# Patient Record
Sex: Male | Born: 1990 | Race: White | Hispanic: No | Smoking: Never smoker
Health system: Southern US, Community
[De-identification: ages and names within clinical notes are randomized; demographics above are authoritative.]

## PROBLEM LIST (undated history)

## (undated) DIAGNOSIS — I10 Essential (primary) hypertension: Secondary | ICD-10-CM

## (undated) DIAGNOSIS — K219 Gastro-esophageal reflux disease without esophagitis: Secondary | ICD-10-CM

## (undated) DIAGNOSIS — E785 Hyperlipidemia, unspecified: Secondary | ICD-10-CM

## (undated) HISTORY — DX: Hyperlipidemia, unspecified: E78.5

## (undated) HISTORY — DX: Gastro-esophageal reflux disease without esophagitis: K21.9

## (undated) HISTORY — PX: NO PAST SURGERIES: SHX2092

## (undated) HISTORY — DX: Essential (primary) hypertension: I10

---

## 2018-07-13 DIAGNOSIS — E782 Mixed hyperlipidemia: Secondary | ICD-10-CM | POA: Insufficient documentation

## 2018-07-13 DIAGNOSIS — M549 Dorsalgia, unspecified: Secondary | ICD-10-CM | POA: Insufficient documentation

## 2018-07-13 NOTE — Progress Notes (Signed)
Subjective:    Patient ID: George Clements, male    DOB: 04/03/1991, 28 y.o.   MRN: [id number]  HPI 28 y.o. MWM presents for back pain, anxiety, as new patient.   He is recently married, no kids, goes to church, and is in several bands as a Physiological scientist.   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 Describes bilateral lower back pain and at midline. He has some tingling in the front of his legs, no numbness or pain down his legs. Worse with standing for long times. No pain at night.  Patient denies fever, hematuria, incontinence, numbness, tingling, weakness and saddle anesthesia. Had Xrays at a chiropractor, shows OA per patient.   He is also a Physiological scientist and states that after he plays sometimes his hands get swollen and it can be hard to grip. States his finger tips gets numb. No neck pain. Happening intermittent X 8 years. Becoming more frequent, not worse.   Denies any rash Denies any eye problems.  BMI is Body mass index is 27.72 kg/m., he is working on diet and exercise. Wt Readings from Last 3 Encounters:  07/16/18 204 lb 6.4 oz (92.7 kg)   His blood pressure is not checked at home, today their BP is BP: (!) 138/92  He has anxiety off and on, will get flushing in his face, SOB, palpitations and pressure in his chest. Will happen randomly but more situational in a crowded place, loud noises, will last several minutes, deep breathing helps and relax.   Medications No current outpatient medications on file prior to visit.   No current facility-administered medications on file prior to visit.     Problem list He has Back pain and Mixed hyperlipidemia on their problem list.  Allergies Not on File  SURGICAL HISTORY He  has no past surgical history on file.   FAMILY HISTORY His family history includes Asthma in his brother and mother; Bipolar disorder in his sister; Scoliosis in his sister.   SOCIAL HISTORY He  reports that he has never smoked.  He has never used smokeless tobacco. He reports that he does not drink alcohol or use drugs.   Review of Systems  Constitutional: Negative.  Negative for fever.  HENT: Negative.   Respiratory: Negative.   Cardiovascular: Negative.  Negative for chest pain.  Gastrointestinal: Negative.  Negative for abdominal pain.  Genitourinary: Negative.  Negative for dysuria.  Musculoskeletal: Positive for back pain and joint swelling. Negative for arthralgias, gait problem, myalgias, neck pain and neck stiffness.  Skin: Negative.   Neurological: Negative for weakness, numbness and headaches.  Psychiatric/Behavioral: Negative for agitation, behavioral problems, confusion, decreased concentration, dysphoric mood, hallucinations, self-injury, sleep disturbance and suicidal ideas. The patient is nervous/anxious. The patient is not hyperactive.        Objective:   Physical Exam Constitutional:      General: He is not in acute distress.    Appearance: He is well-developed.  Neck:     Musculoskeletal: Normal range of motion and neck supple.  Cardiovascular:     Rate and Rhythm: Normal rate and regular rhythm.  Pulmonary:     Effort: Pulmonary effort is normal.     Breath sounds: Normal breath sounds.  Abdominal:     General: Bowel sounds are normal.     Palpations: Abdomen is soft.     Tenderness: There is no abdominal tenderness.  Musculoskeletal:     Comments: Patient is able to  ambulate well. Gait is not  Antalgic. Straight leg raising with dorsiflexion negative bilaterally for radicular symptoms. Sensory exam in the legs are normal. Knee reflexes are normal Ankle reflexes are normal Strength is normal and symmetric in arms and legs. There is SI tenderness to palpation.  There is notparaspinal muscle spasm.  There is not midline tenderness.  FROM of spine.  Bilateral neurovascular exam is normal of hands, no thenar or hypothenar atrophy, Phalen's: negative, sensation normal, strength normal and  Tinel's sign: negative. Elbow, shoulders normal.    Lymphadenopathy:     Cervical: No cervical adenopathy.  Skin:    General: Skin is warm and dry.     Findings: No rash.  Neurological:     Mental Status: He is alert and oriented to person, place, and time.     Cranial Nerves: No cranial nerve deficit.     EKG shows IRBBB, notched P waves, short PR interval, normal QRS but with slight slurring primarily in V1-V2 possible due to body habitus versus preexcitation syndrome- discussed BB- declines at this time, valsalva taught- check labs.      Assessment & Plan:  Arrington was seen today for establish care.  Diagnoses and all orders for this visit:  Vitamin D deficiency -     VITAMIN D 25 Hydroxy (Vit-D Deficiency, Fractures)  Chronic midline low back pain without sciatica -     HLA-B27 antigen - suggest PT/ortho, he is following with a chiropractor - rule out autoimmune with age and xray  Mixed hyperlipidemia -     Lipid panel check lipids decrease fatty foods increase activity.   Medication management -     CBC with Differential/Platelet -     COMPLETE METABOLIC PANEL WITH GFR  Bilateral hand pain -     Sedimentation rate -     Rheumatoid factor - likely mechanical from drumming causing a carpal tunnel - normal exam, suggest wearing brace night of shows  Anxiety -     TSH -     EKG 12-Lead - ?preexcitation syndrome- discussed BB- declines at this time, valsalva taught- check labs.  - The patient was advised to call immediately if he has any concerning symptoms in the interval. The patient voices understanding of current treatment options and is in agreement with the current care plan.The patient knows to call the clinic with any problems, questions or concerns or go to the ER if any further progression of symptoms.

## 2018-07-16 ENCOUNTER — Encounter: Payer: Self-pay | Admitting: Physician Assistant

## 2018-07-16 ENCOUNTER — Ambulatory Visit: Payer: 59 | Admitting: Physician Assistant

## 2018-07-16 VITALS — BP 138/92 | HR 78 | Temp 97.4°F | Ht 72.0 in | Wt 204.4 lb

## 2018-07-16 DIAGNOSIS — G8929 Other chronic pain: Secondary | ICD-10-CM

## 2018-07-16 DIAGNOSIS — E782 Mixed hyperlipidemia: Secondary | ICD-10-CM | POA: Diagnosis not present

## 2018-07-16 DIAGNOSIS — M545 Low back pain: Secondary | ICD-10-CM

## 2018-07-16 DIAGNOSIS — E559 Vitamin D deficiency, unspecified: Secondary | ICD-10-CM

## 2018-07-16 DIAGNOSIS — M79641 Pain in right hand: Secondary | ICD-10-CM

## 2018-07-16 DIAGNOSIS — F419 Anxiety disorder, unspecified: Secondary | ICD-10-CM

## 2018-07-16 DIAGNOSIS — I10 Essential (primary) hypertension: Secondary | ICD-10-CM | POA: Diagnosis not present

## 2018-07-16 DIAGNOSIS — Z79899 Other long term (current) drug therapy: Secondary | ICD-10-CM

## 2018-07-16 DIAGNOSIS — Z136 Encounter for screening for cardiovascular disorders: Secondary | ICD-10-CM

## 2018-07-16 DIAGNOSIS — M79642 Pain in left hand: Secondary | ICD-10-CM

## 2018-07-16 NOTE — Patient Instructions (Signed)
HYPERTENSION INFORMATION  Monitor your blood pressure at home, please keep a record and bring that in with you to your next office visit.   Go to the ER if any CP, SOB, nausea, dizziness, severe HA, changes vision/speech  Due to a recent study, SPRINT, we have changed our goal for the systolic or top blood pressure number. Ideally we want your top number at 120.  [facility], 5000 people were randomized to a goal BP of 120 and 5000 people were randomized to a goal BP of less than 140. The patients with the goal BP at 120 had LESS DEMENTIA, LESS HEART ATTACKS, AND LESS STROKES, AS WELL AS OVERALL DECREASED MORTALITY OR DEATH RATE.   If you are willing, our goal BP is the top number of 120.  Your most recent BP: BP: (!) 138/92   Take your medications faithfully as instructed. Maintain a healthy weight. Get at least 150 minutes of aerobic exercise per week. Minimize salt intake. Minimize alcohol intake  DASH Eating Plan DASH stands for "Dietary Approaches to Stop Hypertension." The DASH eating plan is a healthy eating plan that has been shown to reduce high blood pressure (hypertension). Additional health benefits may include reducing the risk of type 2 diabetes mellitus, heart disease, and stroke. The DASH eating plan may also help with weight loss. WHAT DO I NEED TO KNOW ABOUT THE DASH EATING PLAN? For the DASH eating plan, you will follow these general guidelines:  Choose foods with a percent daily value for sodium of less than 5% (as listed on the food label).  Use salt-free seasonings or herbs instead of table salt or sea salt.  Check with your health care provider or pharmacist before using salt substitutes.  Eat lower-sodium products, often labeled as "lower sodium" or "no salt added."  Eat fresh foods.  Eat more vegetables, fruits, and low-fat dairy products.  Choose whole grains. Look for the word "whole" as the first word in the ingredient list.  Choose fish and  skinless chicken or [place]key more often than red meat. Limit fish, poultry, and meat to 6 oz (170 g) each day.  Limit sweets, desserts, sugars, and sugary drinks.  Choose heart-healthy fats.  Limit cheese to 1 oz (28 g) per day.  Eat more home-cooked food and less restaurant, buffet, and fast food.  Limit fried foods.  Cook foods using methods other than frying.  Limit canned vegetables. If you do use them, rinse them well to decrease the sodium.  When eating at a restaurant, ask that your food be prepared with less salt, or no salt if possible. WHAT FOODS CAN I EAT? Seek help from a dietitian for individual calorie needs. Grains Whole grain or whole wheat bread. Brown rice. Whole grain or whole wheat pasta. Quinoa, bulgur, and whole grain cereals. Low-sodium cereals. Corn or whole wheat flour tortillas. Whole grain cornbread. Whole grain crackers. Low-sodium crackers. Vegetables Fresh or frozen vegetables (raw, steamed, roasted, or grilled). Low-sodium or reduced-sodium tomato and vegetable juices. Low-sodium or reduced-sodium tomato sauce and paste. Low-sodium or reduced-sodium canned vegetables.  Fruits All fresh, canned (in natural juice), or frozen fruits. Meat and Other Protein Products Ground beef (85% or leaner), grass-fed beef, or beef trimmed of fat. Skinless chicken or [place]key. Ground chicken or [place]key. Pork trimmed of fat. All fish and seafood. Eggs. Dried beans, peas, or lentils. Unsalted nuts and seeds. Unsalted canned beans. Dairy Low-fat dairy products, such as skim or 1% milk, 2% or reduced-fat  cheeses, low-fat ricotta or cottage cheese, or plain low-fat yogurt. Low-sodium or reduced-sodium cheeses. Fats and Oils Tub margarines without trans fats. Light or reduced-fat mayonnaise and salad dressings (reduced sodium). Avocado. Safflower, olive, or canola oils. Natural peanut or almond butter. Other Unsalted popcorn and pretzels. The items listed above may not be a  complete list of recommended foods or beverages. Contact your dietitian for more options. WHAT FOODS ARE NOT RECOMMENDED? Grains White bread. White pasta. White rice. Refined cornbread. Bagels and croissants. Crackers that contain trans fat. Vegetables Creamed or fried vegetables. Vegetables in a cheese sauce. Regular canned vegetables. Regular canned tomato sauce and paste. Regular tomato and vegetable juices. Fruits Dried fruits. Canned fruit in light or heavy syrup. Fruit juice. Meat and Other Protein Products Fatty cuts of meat. Ribs, chicken wings, bacon, sausage, bologna, salami, chitterlings, fatback, hot dogs, bratwurst, and packaged luncheon meats. Salted nuts and seeds. Canned beans with salt. Dairy Whole or 2% milk, cream, half-and-half, and cream cheese. Whole-fat or sweetened yogurt. Full-fat cheeses or blue cheese. Nondairy creamers and whipped toppings. Processed cheese, cheese spreads, or cheese curds. Condiments Onion and garlic salt, seasoned salt, table salt, and sea salt. Canned and packaged gravies. Worcestershire sauce. Tartar sauce. Barbecue sauce. Teriyaki sauce. Soy sauce, including reduced sodium. Steak sauce. Fish sauce. Oyster sauce. Cocktail sauce. Horseradish. Ketchup and mustard. Meat flavorings and tenderizers. Bouillon cubes. Hot sauce. Tabasco sauce. Marinades. Taco seasonings. Relishes. Fats and Oils Butter, stick margarine, lard, shortening, ghee, and bacon fat. Coconut, palm kernel, or palm oils. Regular salad dressings. Other Pickles and olives. Salted popcorn and pretzels. The items listed above may not be a complete list of foods and beverages to avoid. Contact your dietitian for more information. WHERE CAN I FIND MORE INFORMATION? National Heart, Lung, and Blood Institute: [employer] Document Released: 05/30/2011 Document Revised: 10/25/2013 Document Reviewed: 04/14/2013 [facility] Patient Information 2015  [place], [place]. This information is not intended to replace advice given to you by your health care provider. Make sure you discuss any questions you have with your health care provider.    Do the carpal tunnel brace from a pharmacy at night for 4-6 weeks, if this does not help we can refer to ortho.  Carpal Tunnel Syndrome  Carpal tunnel syndrome is a condition that causes pain in your hand and arm. The carpal tunnel is a narrow area located on the palm side of your wrist. Repeated wrist motion or certain diseases may cause swelling within the tunnel. This swelling pinches the main nerve in the wrist (median nerve). What are the causes? This condition may be caused by:  Repeated wrist motions.  Wrist injuries.  Arthritis.  A cyst or tumor in the carpal tunnel.  Fluid buildup during pregnancy.  Sometimes the cause of this condition is not known. What increases the risk? This condition is more likely to develop in:  People who have jobs that cause them to repeatedly move their wrists in the same motion, such as Health visitor.  Women.  People with certain conditions, such as: ? Diabetes. ? Obesity. ? An underactive thyroid (hypothyroidism). ? Kidney failure.  What are the signs or symptoms? Symptoms of this condition include:  A tingling feeling in your fingers, especially in your thumb, index, and middle fingers.  Tingling or numbness in your hand.  An aching feeling in your entire arm, especially when your wrist and elbow are bent for long periods of time.  Wrist pain that goes up your arm to  your shoulder.  Pain that goes down into your palm or fingers.  A weak feeling in your hands. You may have trouble grabbing and holding items.  Your symptoms may feel worse during the night. How is this diagnosed? This condition is diagnosed with a medical history and physical exam. You may also have tests, including:  An electromyogram (EMG). This test measures  electrical signals sent by your nerves into the muscles.  X-rays.  How is this treated? Treatment for this condition includes:  Lifestyle changes. It is important to stop doing or modify the activity that caused your condition.  Physical or occupational therapy.  Medicines for pain and inflammation. This may include medicine that is injected into your wrist.  A wrist splint.  Surgery.  Follow these instructions at home: If you have a splint:  Wear it as told by your health care provider. Remove it only as told by your health care provider.  Loosen the splint if your fingers become numb and tingle, or if they turn cold and blue.  Keep the splint clean and dry. General instructions  Take over-the-counter and prescription medicines only as told by your health care provider.  Rest your wrist from any activity that may be causing your pain. If your condition is work related, talk to your employer about changes that can be made, such as getting a wrist pad to use while typing.  If directed, apply ice to the painful area: ? Put ice in a plastic bag. ? Place a towel between your skin and the bag. ? Leave the ice on for 20 minutes, 2-3 times per day.  Keep all follow-up visits as told by your health care provider. This is important.  Do any exercises as told by your health care provider, physical therapist, or occupational therapist. Contact a health care provider if:  You have new symptoms.  Your pain is not controlled with medicines.  Your symptoms get worse. This information is not intended to replace advice given to you by your health care provider. Make sure you discuss any questions you have with your health care provider. Document Released: 06/07/2000 Document Revised: 10/19/2015 Document Reviewed: 10/26/2014 Elsevier Interactive Patient Education  [employer]2018 [employer] Inc.

## 2018-07-17 LAB — RHEUMATOID FACTOR: Rheumatoid fact SerPl-aCnc: <14 IU/mL (ref <14)

## 2018-07-17 LAB — LIPID PANEL
CHOL/HDL RATIO: 4.4 (calc) (ref <5.0)
Cholesterol: 218 mg/dL — ABNORMAL HIGH (ref <200)
HDL: 49 mg/dL (ref >40)
LDL CHOLESTEROL (CALC): 154 mg/dL — AB
Non-HDL Cholesterol (Calc): 169 mg/dL (calc) — ABNORMAL HIGH (ref <130)
Triglycerides: 58 mg/dL (ref <150)

## 2018-07-17 LAB — COMPLETE METABOLIC PANEL WITH GFR
AG Ratio: 1.7 (calc) (ref 1.0–2.5)
ALBUMIN MSPROF: 4.7 g/dL (ref 3.6–5.1)
ALT: 19 U/L (ref 9–46)
AST: 16 U/L (ref 10–40)
Alkaline phosphatase (APISO): 69 U/L (ref 40–115)
BUN: 14 mg/dL (ref 7–25)
CO2: 26 mmol/L (ref 20–32)
Calcium: 9.5 mg/dL (ref 8.6–10.3)
Chloride: 106 mmol/L (ref 98–110)
Creat: 0.9 mg/dL (ref 0.60–1.35)
GFR, Est African American: 136 mL/min/{1.73_m2} (ref >=60)
GFR, Est Non African American: 117 mL/min/{1.73_m2} (ref >=60)
Globulin: 2.7 g/dL (calc) (ref 1.9–3.7)
Glucose, Bld: 89 mg/dL (ref 65–99)
Potassium: 4.6 mmol/L (ref 3.5–5.3)
Sodium: 138 mmol/L (ref 135–146)
Total Bilirubin: 0.5 mg/dL (ref 0.2–1.2)
Total Protein: 7.4 g/dL (ref 6.1–8.1)

## 2018-07-17 LAB — SEDIMENTATION RATE: Sed Rate: 2 mm/h (ref 0–15)

## 2018-07-17 LAB — TSH: TSH: 1.32 mIU/L (ref 0.40–4.50)

## 2018-07-17 LAB — HLA-B27 ANTIGEN: HLA-B27 Antigen: NEGATIVE

## 2018-07-17 LAB — CBC WITH DIFFERENTIAL/PLATELET
Absolute Monocytes: 461 cells/uL (ref 200–950)
BASOS ABS: 19 {cells}/uL (ref 0–200)
Basophils Relative: 0.4 %
EOS ABS: 28 {cells}/uL (ref 15–500)
Eosinophils Relative: 0.6 %
HCT: 43.8 % (ref 38.5–50.0)
HEMOGLOBIN: 15.3 g/dL (ref 13.2–17.1)
Lymphs Abs: 1227 cells/uL (ref 850–3900)
MCH: 30.7 pg (ref 27.0–33.0)
MCHC: 34.9 g/dL (ref 32.0–36.0)
MCV: 87.8 fL (ref 80.0–100.0)
MPV: 10.6 fL (ref 7.5–12.5)
Monocytes Relative: 9.8 %
Neutro Abs: 2966 cells/uL (ref 1500–7800)
Neutrophils Relative %: 63.1 %
Platelets: 249 10*3/uL (ref 140–400)
RBC: 4.99 10*6/uL (ref 4.20–5.80)
RDW: 12.7 % (ref 11.0–15.0)
Total Lymphocyte: 26.1 %
WBC: 4.7 10*3/uL (ref 3.8–10.8)

## 2018-07-17 LAB — VITAMIN D 25 HYDROXY (VIT D DEFICIENCY, FRACTURES): Vit D, 25-Hydroxy: 29 ng/mL — ABNORMAL LOW (ref 30–100)

## 2018-10-31 ENCOUNTER — Other Ambulatory Visit: Payer: Self-pay | Admitting: Physician Assistant

## 2018-10-31 MED ORDER — SULFAMETHOXAZOLE-TRIMETHOPRIM 800-160 MG PO TABS: 1.0000 | ORAL_TABLET | Freq: Two times a day (BID) | ORAL | 0 refills | Status: DC

## 2018-11-02 ENCOUNTER — Other Ambulatory Visit: Payer: Self-pay

## 2018-11-02 ENCOUNTER — Ambulatory Visit (INDEPENDENT_AMBULATORY_CARE_PROVIDER_SITE_OTHER): Payer: 59 | Admitting: Physician Assistant

## 2018-11-02 VITALS — BP 142/90 | HR 101 | Temp 98.7°F | Ht 72.0 in | Wt 204.6 lb

## 2018-11-02 DIAGNOSIS — L0291 Cutaneous abscess, unspecified: Secondary | ICD-10-CM | POA: Diagnosis not present

## 2018-11-02 NOTE — Progress Notes (Signed)
   Subjective:    Patient ID: George Clements, male    DOB: October 22, 1990, 28 y.o.   MRN: [id number]  HPI 28 y.o. WM presents with abscess x Thursday morning.  He states he woke up in the morning, noticed a bump with little white dot in the middle Thursday morning, then that night he has severe swelling.  Started draining Friday, has been draining off and on since then, mostly pus with some blood. Bactrim was sent in but he has not started it yet.  No fever, chills.   Blood pressure (!) 142/90, pulse (!) 101, temperature 98.7 F (37.1 C), height 6' (1.829 m), weight 204 lb 9.6 oz (92.8 kg), SpO2 99 %.  Medications Current Outpatient Medications on File Prior to Visit  Medication Sig  . sulfamethoxazole-trimethoprim (BACTRIM DS) 800-160 MG tablet Take 1 tablet by mouth 2 (two) times daily. (Patient not taking: Reported on 11/02/2018)   No current facility-administered medications on file prior to visit.     Problem list He has Back pain and Mixed hyperlipidemia on their problem list.   Review of Systems  Constitutional: Negative.  Negative for chills and fever.  Skin: Positive for wound (right forearm).       Objective:   Physical Exam Constitutional:      General: He is not in acute distress.    Appearance: Normal appearance. He is normal weight.  HENT:     Head: Normocephalic and atraumatic.  Skin:    General: Skin is warm and dry.     Comments: Right proximal ulnar side of forearm with 5x3 indurated abscess with 2 cm area of fluctuace, with erythema, warmth, tenderness.   Neurological:     General: No focal deficit present.     Mental Status: He is alert and oriented to person, place, and time.     Gait: Gait normal.    PROCEDURE NOTE: I&D of Abscess Verbal consent obtained. Local anesthesia with 1 cc of 1% lidocaine. Site cleansed with alcohol  Incision of 1cm was made using a 11 blade, discharge of copious amounts of pus and serosanguinous fluid. Wound cavity was  explored with curved hemostats.Cleansed and dressed. After care instructions provided. Patient to return to clinic on as needed for reevaluation/repacking.     Assessment & Plan:  Diagnoses and all orders for this visit:  Abscess Apply hot compresses frequently to promote drainage. Oral antibiotics -- see med orders. I & D procedure as above. RTC PRN.   The patient was advised to call immediately if he has any concerning symptoms in the interval. The patient voices understanding of current treatment options and is in agreement with the current care plan.The patient knows to call the clinic with any problems, questions or concerns or go to the ER if any further progression of symptoms.

## 2018-11-02 NOTE — Patient Instructions (Signed)
  ABSCESS  start antibiotic as prescribed.  Continue warm wet compresses do 2-3 times a day  Call if any worsening discharge, fever, chills, warmth, redness.   Abscess An abscess is an infected area that contains a collection of pus and debris. It can occur in almost any part of the body. An abscess is also known as a furuncle or boil. CAUSES  An abscess occurs when tissue gets infected. This can occur from blockage of oil or sweat glands, infection of hair follicles, or a minor injury to the skin. As the body tries to fight the infection, pus collects in the area and creates pressure under the skin. This pressure causes pain. People with weakened immune systems have difficulty fighting infections and get certain abscesses more often.  SYMPTOMS Usually an abscess develops on the skin and becomes a painful mass that is red, warm, and tender. If the abscess forms under the skin, you may feel a moveable soft area under the skin. Some abscesses break open (rupture) on their own, but most will continue to get worse without care. The infection can spread deeper into the body and eventually into the bloodstream, causing you to feel ill.  TREATMENT  Your caregiver may prescribe antibiotic medicines to fight the infection. However, taking antibiotics alone usually does not cure an abscess. Your caregiver may need to make a small cut (incision) in the abscess to drain the pus. In some cases, gauze is packed into the abscess to reduce pain and to continue draining the area. HOME CARE INSTRUCTIONS   Only take over-the-counter or prescription medicines for pain, discomfort, or fever as directed by your caregiver.  If you were prescribed antibiotics, take them as directed. Finish them even if you start to feel better.  If gauze is used, follow your caregiver's directions for changing the gauze.  To avoid spreading the infection:  Keep your draining abscess covered with a bandage.  Wash your hands well.   Do not share personal care items, towels, or whirlpools with others.  Avoid skin contact with others.  Keep your skin and clothes clean around the abscess.  Keep all follow-up appointments as directed by your caregiver. SEEK MEDICAL CARE IF:   You have increased pain, swelling, redness, fluid drainage, or bleeding.  You have muscle aches, chills, or a general ill feeling.  You have a fever. MAKE SURE YOU:   Understand these instructions.  Will watch your condition.  Will get help right away if you are not doing well or get worse.

## 2018-12-14 NOTE — Progress Notes (Deleted)
   Subjective:    Patient ID: George Clements, male    DOB: 01/07/1991, 28 y.o.   MRN: [id number]  HPI 28 y.o. WM presents with prepatellar bursitis. He works Dance movement psychotherapist.   There were no vitals taken for this visit.   Review of Systems     Objective:   Physical Exam        Assessment & Plan:

## 2018-12-15 ENCOUNTER — Ambulatory Visit: Payer: 59 | Admitting: Physician Assistant

## 2019-01-13 NOTE — Progress Notes (Signed)
Subjective:    Patient ID: George Clements, male    DOB: 1991-02-12, 28 y.o.   MRN: [id number]  HPI 28 y.o. MWM presents for back pain, anxiety, as CPE.   He is recently married, no kids, goes to church, and is in several bands as a Physiological scientist.   His blood pressure has been controlled at home, today their BP is BP: (!) 138/96  He does not workout but has physical sternous job. He denies chest pain, shortness of breath, dizziness.  He has history of back pain, and has had bilateral prepatellar bursitis due to this job.    He is not on cholesterol medication and denies myalgias. His cholesterol is not at goal. The cholesterol last visit was:   Lab Results  Component Value Date   CHOL 218 (H) 07/16/2018   HDL 49 07/16/2018   LDLCALC 154 (H) 07/16/2018   TRIG 58 07/16/2018   CHOLHDL 4.4 07/16/2018   Patient is on Vitamin D supplement.   Lab Results  Component Value Date   VD25OH 29 (L) 07/16/2018     BMI is Body mass index is 28.16 kg/m., he is working on diet and exercise. Wt Readings from Last 3 Encounters:  01/14/19 207 lb 9.6 oz (94.2 kg)  11/02/18 204 lb 9.6 oz (92.8 kg)  07/16/18 204 lb 6.4 oz (92.7 kg)   He has anxiety off and on, will get flushing in his face, SOB, palpitations and pressure in his chest. Will happen randomly but more situational in a crowded place, loud noises, will last several minutes, deep breathing helps and relax. Has improved and having less with less stress, has not had panic attack in months.   Medications No current outpatient medications on file prior to visit.   No current facility-administered medications on file prior to visit.     Problem list He has Back pain; Mixed hyperlipidemia; Hypertension; and Vitamin D deficiency on their problem list.  Allergies No Known Allergies  SURGICAL HISTORY He  has no past surgical history on file.   FAMILY HISTORY His family history includes Asthma in his brother and mother; Bipolar disorder in  his sister; Scoliosis in his sister.   SOCIAL HISTORY He  reports that he has never smoked. He has never used smokeless tobacco. He reports that he does not drink alcohol or use drugs.   Review of Systems  Constitutional: Negative.  Negative for fever.  HENT: Negative.   Respiratory: Negative.   Cardiovascular: Negative.  Negative for chest pain.  Gastrointestinal: Negative.  Negative for abdominal pain.  Genitourinary: Negative.  Negative for dysuria.  Musculoskeletal: Positive for back pain and joint swelling. Negative for arthralgias, gait problem, myalgias, neck pain and neck stiffness.  Skin: Negative.   Neurological: Negative for weakness, numbness and headaches.  Psychiatric/Behavioral: Negative for agitation, behavioral problems, confusion, decreased concentration, dysphoric mood, hallucinations, self-injury, sleep disturbance and suicidal ideas. The patient is nervous/anxious. The patient is not hyperactive.        Objective:   Physical Exam Constitutional:      General: He is not in acute distress.    Appearance: He is well-developed.  Neck:     Musculoskeletal: Normal range of motion and neck supple.  Cardiovascular:     Rate and Rhythm: Normal rate and regular rhythm.  Pulmonary:     Effort: Pulmonary effort is normal.     Breath sounds: Normal breath sounds.  Abdominal:     General: Bowel sounds are normal.  Palpations: Abdomen is soft.     Tenderness: There is no abdominal tenderness.  Musculoskeletal:     Comments:    Lymphadenopathy:     Cervical: No cervical adenopathy.  Skin:    General: Skin is warm and dry.     Findings: No rash.  Neurological:     Mental Status: He is alert and oriented to person, place, and time.     Cranial Nerves: No cranial nerve deficit.     EKG shows IRBBB, notched P waves, normal PR interval, normal QRS with notching but with slight slurring primarily in V1-V2 possible due to body habitus versus preexcitation syndrome       Assessment & Plan:    Mixed hyperlipidemia -     Lipid panel check lipids decrease fatty foods increase activity.   Medication management -     COMPLETE METABOLIC PANEL WITH GFR -     Magnesium  Vitamin D deficiency -     Vitamin D, Ergocalciferol, (DRISDOL) 1.25 MG (50000 UT) CAPS capsule; 1 pill 3 days a week for vitamin d deficiency -     VITAMIN D 25 Hydroxy (Vit-D Deficiency, Fractures)  Essential hypertension -     amLODipine (NORVASC) 5 MG tablet; Take 1 tablet (5 mg total) by mouth daily. - continue medications, DASH diet, exercise and monitor at home. Call if greater than 130/80.   Screening for cardiovascular condition -     EKG 12-Lead  Encounter for general adult medical examination with abnormal findings -     CBC with Differential/Platelet -     COMPLETE METABOLIC PANEL WITH GFR -     Lipid panel -     VITAMIN D 25 Hydroxy (Vit-D Deficiency, Fractures) -     Magnesium -     EKG 12-Lead  Anxiety -    Improved with decrease stress, continue to monitor

## 2019-01-14 ENCOUNTER — Ambulatory Visit: Payer: 59 | Admitting: Physician Assistant

## 2019-01-14 ENCOUNTER — Other Ambulatory Visit: Payer: Self-pay

## 2019-01-14 VITALS — BP 138/96 | HR 78 | Temp 97.9°F | Ht 72.0 in | Wt 207.6 lb

## 2019-01-14 DIAGNOSIS — Z136 Encounter for screening for cardiovascular disorders: Secondary | ICD-10-CM | POA: Diagnosis not present

## 2019-01-14 DIAGNOSIS — F419 Anxiety disorder, unspecified: Secondary | ICD-10-CM

## 2019-01-14 DIAGNOSIS — Z0000 Encounter for general adult medical examination without abnormal findings: Secondary | ICD-10-CM | POA: Diagnosis not present

## 2019-01-14 DIAGNOSIS — E782 Mixed hyperlipidemia: Secondary | ICD-10-CM

## 2019-01-14 DIAGNOSIS — Z79899 Other long term (current) drug therapy: Secondary | ICD-10-CM

## 2019-01-14 DIAGNOSIS — E559 Vitamin D deficiency, unspecified: Secondary | ICD-10-CM | POA: Insufficient documentation

## 2019-01-14 DIAGNOSIS — M545 Low back pain, unspecified: Secondary | ICD-10-CM

## 2019-01-14 DIAGNOSIS — G8929 Other chronic pain: Secondary | ICD-10-CM

## 2019-01-14 DIAGNOSIS — Z0001 Encounter for general adult medical examination with abnormal findings: Secondary | ICD-10-CM

## 2019-01-14 DIAGNOSIS — I10 Essential (primary) hypertension: Secondary | ICD-10-CM | POA: Insufficient documentation

## 2019-01-14 MED ORDER — VITAMIN D (ERGOCALCIFEROL) 1.25 MG (50000 UNIT) PO CAPS: ORAL_CAPSULE | ORAL | 1 refills | Status: DC

## 2019-01-14 MED ORDER — AMLODIPINE BESYLATE 5 MG PO TABS: 5.0000 mg | ORAL_TABLET | Freq: Every day | ORAL | 3 refills | Status: DC

## 2019-01-14 NOTE — Patient Instructions (Addendum)
Your LDL could improve, ideally we want it under a 100.  Your LDL is the bad cholesterol that can lead to heart attack and stroke. To lower your number you can decrease your fatty foods, red meat, cheese, milk and increase fiber like whole grains and veggies. You can also add a fiber supplement like Citracel or Benefiber, these do not cause gas and bloating and are safe to use. Especially if you have a strong family history of heart disease or stroke or you have evidence of plaque on any imaging like a chest xray, we may discuss at your next office visit putting you on a medication to get your number below 100.   VITAMIN D IS IMPORTANT  Vitamin D goal is between 60-80  Please make sure that you are taking your Vitamin D as directed.   It is very important as a natural anti-inflammatory   helping hair, skin, and nails, as well as reducing stroke and heart attack risk.   It helps your bones and helps with mood.  We want you on at least 5000 IU daily  It also decreases numerous cancer risks so please take it as directed.   Low Vit D is associated with a 200-300% higher risk for CANCER   and 200-300% higher risk for HEART   ATTACK  &  STROKE.    .....................................Marland Kitchen.  It is also associated with higher death rate at younger ages,   autoimmune diseases like Rheumatoid arthritis, Lupus, Multiple Sclerosis.     Also many other serious conditions, like depression, Alzheimer's  Dementia, infertility, muscle aches, fatigue, fibromyalgia - just to name a few.  +++++++++++++++++++  Can get liquid vitamin D from [place]  OR [place] at  [facility] [address], [place], [place] [id number] Or you can try earth [employer]    HYPERTENSION INFORMATION  Monitor your blood pressure at home, please keep a record and bring that in with you to your next office visit.   Increase the norvasc to 5 mg  Go to the ER if any CP, SOB, nausea, dizziness, severe HA, changes  vision/speech  Testing/Procedures: HOW TO TAKE YOUR BLOOD PRESSURE:  Rest 5 minutes before taking your blood pressure.  Don't smoke or drink caffeinated beverages for at least 30 minutes before.  Take your blood pressure before (not after) you eat.  Sit comfortably with your back supported and both feet on the floor (don't cross your legs).  Elevate your arm to heart level on a table or a desk.  Use the proper sized cuff. It should fit smoothly and snugly around your bare upper arm. There should be enough room to slip a fingertip under the cuff. The bottom edge of the cuff should be 1 inch above the crease of the elbow.  Due to a recent study, SPRINT, we have changed our goal for the systolic or top blood pressure number. Ideally we want your top number at 120.  In the [facility] Trial, 5000 people were randomized to a goal BP of 120 and 5000 people were randomized to a goal BP of less than 140. The patients with the goal BP at 120 had LESS DEMENTIA, LESS HEART ATTACKS, AND LESS STROKES, AS WELL AS OVERALL DECREASED MORTALITY OR DEATH RATE.   There was another study that showed taking your blood pressure medications at night decrease cardiovascular events.  However if you are on a fluid pill, please take this in the morning.   If you are willing, our  goal BP is the top number of 120.  Your most recent BP: BP: (!) 138/96   Take your medications faithfully as instructed. Maintain a healthy weight. Get at least 150 minutes of aerobic exercise per week. Minimize salt intake. Minimize alcohol intake  DASH Eating Plan DASH stands for "Dietary Approaches to Stop Hypertension." The DASH eating plan is a healthy eating plan that has been shown to reduce high blood pressure (hypertension). Additional health benefits may include reducing the risk of type 2 diabetes mellitus, heart disease, and stroke. The DASH eating plan may also help with weight loss. WHAT DO I NEED TO KNOW ABOUT THE DASH EATING  PLAN? For the DASH eating plan, you will follow these general guidelines:  Choose foods with a percent daily value for sodium of less than 5% (as listed on the food label).  Use salt-free seasonings or herbs instead of table salt or sea salt.  Check with your health care provider or pharmacist before using salt substitutes.  Eat lower-sodium products, often labeled as "lower sodium" or "no salt added."  Eat fresh foods.  Eat more vegetables, fruits, and low-fat dairy products.  Choose whole grains. Look for the word "whole" as the first word in the ingredient list.  Choose fish and skinless chicken or [place] more often than red meat. Limit fish, poultry, and meat to 6 oz (170 g) each day.  Limit sweets, desserts, sugars, and sugary drinks.  Choose heart-healthy fats.  Limit cheese to 1 oz (28 g) per day.  Eat more home-cooked food and less restaurant, buffet, and fast food.  Limit fried foods.  Cook foods using methods other than frying.  Limit canned vegetables. If you do use them, rinse them well to decrease the sodium.  When eating at a restaurant, ask that your food be prepared with less salt, or no salt if possible. WHAT FOODS CAN I EAT? Seek help from a dietitian for individual calorie needs. Grains Whole grain or whole wheat bread. Brown rice. Whole grain or whole wheat pasta. Quinoa, bulgur, and whole grain cereals. Low-sodium cereals. Corn or whole wheat flour tortillas. Whole grain cornbread. Whole grain crackers. Low-sodium crackers. Vegetables Fresh or frozen vegetables (raw, steamed, roasted, or grilled). Low-sodium or reduced-sodium tomato and vegetable juices. Low-sodium or reduced-sodium tomato sauce and paste. Low-sodium or reduced-sodium canned vegetables.  Fruits All fresh, canned (in natural juice), or frozen fruits. Meat and Other Protein Products Ground beef (85% or leaner), grass-fed beef, or beef trimmed of fat. Skinless chicken or [place]. Ground  chicken or [place]. Pork trimmed of fat. All fish and seafood. Eggs. Dried beans, peas, or lentils. Unsalted nuts and seeds. Unsalted canned beans. Dairy Low-fat dairy products, such as skim or 1% milk, 2% or reduced-fat cheeses, low-fat ricotta or cottage cheese, or plain low-fat yogurt. Low-sodium or reduced-sodium cheeses. Fats and Oils Tub margarines without trans fats. Light or reduced-fat mayonnaise and salad dressings (reduced sodium). Avocado. Safflower, olive, or canola oils. Natural peanut or almond butter. Other Unsalted popcorn and pretzels. The items listed above may not be a complete list of recommended foods or beverages. Contact your dietitian for more options. WHAT FOODS ARE NOT RECOMMENDED? Grains White bread. White pasta. White rice. Refined cornbread. Bagels and croissants. Crackers that contain trans fat. Vegetables Creamed or fried vegetables. Vegetables in a cheese sauce. Regular canned vegetables. Regular canned tomato sauce and paste. Regular tomato and vegetable juices. Fruits Dried fruits. Canned fruit in light or heavy syrup. Fruit juice. Meat and  Other Protein Products Fatty cuts of meat. Ribs, chicken wings, bacon, sausage, bologna, salami, chitterlings, fatback, hot dogs, bratwurst, and packaged luncheon meats. Salted nuts and seeds. Canned beans with salt. Dairy Whole or 2% milk, cream, half-and-half, and cream cheese. Whole-fat or sweetened yogurt. Full-fat cheeses or blue cheese. Nondairy creamers and whipped toppings. Processed cheese, cheese spreads, or cheese curds. Condiments Onion and garlic salt, seasoned salt, table salt, and sea salt. Canned and packaged gravies. Worcestershire sauce. Tartar sauce. Barbecue sauce. Teriyaki sauce. Soy sauce, including reduced sodium. Steak sauce. Fish sauce. Oyster sauce. Cocktail sauce. Horseradish. Ketchup and mustard. Meat flavorings and tenderizers. Bouillon cubes. Hot sauce. Tabasco sauce. Marinades. Taco seasonings.  Relishes. Fats and Oils Butter, stick margarine, lard, shortening, ghee, and bacon fat. Coconut, palm kernel, or palm oils. Regular salad dressings. Other Pickles and olives. Salted popcorn and pretzels. The items listed above may not be a complete list of foods and beverages to avoid. Contact your dietitian for more information. WHERE CAN I FIND MORE INFORMATION? National Heart, Lung, and Blood Institute: [employer].itwww.nhlbi.nih.gov/health/health-topics/topics/dash/ Document Released: 05/30/2011 Document Revised: 10/25/2013 Document Reviewed: 04/14/2013 [facility] Patient Information 2015 [employer] [place]. This information is not intended to replace advice given to you by your health care provider. Make sure you discuss any questions you have with your health care provider.

## 2019-01-15 LAB — LIPID PANEL
Cholesterol: 234 mg/dL — ABNORMAL HIGH (ref <200)
HDL: 50 mg/dL (ref >=40)
LDL Cholesterol (Calc): 168 mg/dL (calc) — ABNORMAL HIGH
Non-HDL Cholesterol (Calc): 184 mg/dL (calc) — ABNORMAL HIGH (ref <130)
Total CHOL/HDL Ratio: 4.7 (calc) (ref <5.0)
Triglycerides: 69 mg/dL (ref <150)

## 2019-01-15 LAB — CBC WITH DIFFERENTIAL/PLATELET
Absolute Monocytes: 578 cells/uL (ref 200–950)
Basophils Absolute: 22 cells/uL (ref 0–200)
Basophils Relative: 0.4 %
Eosinophils Absolute: 39 cells/uL (ref 15–500)
Eosinophils Relative: 0.7 %
HCT: 43.2 % (ref 38.5–50.0)
Hemoglobin: 15 g/dL (ref 13.2–17.1)
Lymphs Abs: 1524 cells/uL (ref 850–3900)
MCH: 30.5 pg (ref 27.0–33.0)
MCHC: 34.7 g/dL (ref 32.0–36.0)
MCV: 88 fL (ref 80.0–100.0)
MPV: 10.4 fL (ref 7.5–12.5)
Monocytes Relative: 10.5 %
Neutro Abs: 3339 cells/uL (ref 1500–7800)
Neutrophils Relative %: 60.7 %
Platelets: 223 10*3/uL (ref 140–400)
RBC: 4.91 10*6/uL (ref 4.20–5.80)
RDW: 12.7 % (ref 11.0–15.0)
Total Lymphocyte: 27.7 %
WBC: 5.5 10*3/uL (ref 3.8–10.8)

## 2019-01-15 LAB — COMPLETE METABOLIC PANEL WITH GFR
AG Ratio: 1.6 (calc) (ref 1.0–2.5)
ALT: 27 U/L (ref 9–46)
AST: 18 U/L (ref 10–40)
Albumin: 4.6 g/dL (ref 3.6–5.1)
Alkaline phosphatase (APISO): 65 U/L (ref 36–130)
BUN: 18 mg/dL (ref 7–25)
CO2: 26 mmol/L (ref 20–32)
Calcium: 9.7 mg/dL (ref 8.6–10.3)
Chloride: 105 mmol/L (ref 98–110)
Creat: 0.97 mg/dL (ref 0.60–1.35)
GFR, Est African American: 123 mL/min/{1.73_m2} (ref >=60)
GFR, Est Non African American: 107 mL/min/{1.73_m2} (ref >=60)
Globulin: 2.8 g/dL (calc) (ref 1.9–3.7)
Glucose, Bld: 94 mg/dL (ref 65–99)
Potassium: 4.3 mmol/L (ref 3.5–5.3)
Sodium: 140 mmol/L (ref 135–146)
Total Bilirubin: 0.6 mg/dL (ref 0.2–1.2)
Total Protein: 7.4 g/dL (ref 6.1–8.1)

## 2019-01-15 LAB — VITAMIN D 25 HYDROXY (VIT D DEFICIENCY, FRACTURES): Vit D, 25-Hydroxy: 37 ng/mL (ref 30–100)

## 2019-01-15 LAB — MAGNESIUM: Magnesium: 2.1 mg/dL (ref 1.5–2.5)

## 2020-01-17 NOTE — Progress Notes (Signed)
Complete Physical  Assessment and Plan: Anxiety Declines meds at this time stress management techniques discussed, increase water, good sleep hygiene discussed/get sleep study, increase exercise, and increase veggies.   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 Sleep apnea, REM sleep DO, etc.  PAtient snores, wakes up every 2 hours, will wake himself up -     Hemoglobin A1c -     Ambulatory referral to Sleep Studies  Frequent nocturnal awakening -     Ambulatory referral to Sleep Studies  Gastroesophageal reflux disease without esophagitis -     Celiac Disease Comprehensive Panel with Reflexes -     H. pylori breath test -     omeprazole (PRILOSEC) 40 MG capsule; Take 1 capsule (40 mg total) by mouth daily. No ETOH, no NSAIDS Rule out H pylori and celiac Benign AB- no pain Check labs  Dizziness during and after food.  -     EKG 12-Lead- shows short PR again, no ST changes.  -     Insulin, random - check A1C, insulin Check for celiac and hpylori with GERD - normal pulses in all extremities, he is able to work out without any symptoms and he does not smoke and has low risk for AB ischemia other than poorly controlled cholesterol.    Encounter for general adult medical examination with abnormal findings 1 year  Mixed hyperlipidemia -     Lipid panel check lipids decrease fatty foods increase activity.   Essential hypertension -     CBC with Differential/Platelet -     COMPLETE METABOLIC PANEL WITH GFR -     TSH -     Magnesium - continue medications, DASH diet, exercise and monitor at home. Call if greater than 130/80. Can continue off norvasc for now  Vitamin D deficiency -     VITAMIN D 25 Hydroxy (Vit-D Deficiency, Fractures)  Chronic midline low back pain without sciatica Improved with massage Discussed PT- may refer to intergrative health if insurance will cover PT  Screening  for cardiovascular condition -     EKG 12-Lead- short PR interval at 106, no delta slurring, + IRBBB, +notched P wave, other than dizziness with food, no palpitations, CP, SOB with exercise/exertion Will monitor- may need further evaluation pending these labs  Medication management Monitor  Discussed med's effects and SE's. Screening labs and tests as requested with regular follow-up as recommended. Over 40 minutes of exam, counseling, chart review and critical decision making was performed  HPI Patient presents for a complete physical.   He is going through divorce. He has been throwing himself into work, working weekends etc. He is going to try to do more self care going forward.  He has been having trouble sleeping since Jan. Will initiate sleep well but will wake up every 2 hours, does not feel rested in the AM. He does snore, will wake himself up occasionally. He also states that if he sleeps on his back, for years, 90% of the time he will experience sleep paralysis. Both of his parents snore but do not have documented sleep apnea. His weight has increased over the last year.   BMI is Body mass index is 28.75 kg/m., he is working on diet and exercise. Wt Readings from Last 3 Encounters:  01/19/20 (!) 212 lb (96.2 kg)  01/14/19 207 lb 9.6 oz (94.2 kg)  11/02/18 204 lb 9.6 oz (92.8 kg)    Has  history of back pain- has been doing massage once a month- has been helping. Discussed seeing PT  Patient states he has had dizziness/light headed with food, worsening since Feb. States will happen during and after he eats, will be with anything may be worse with spicy foods, will happen during eating and last for 20 mins. He will take deep breaths. No AB pain, nausea, vomiting, diarrhea, constipation. Has BM 2 x a day, no strain, no changes in color.  No aleve, no ibuprofen. No ETOH. No supplements.   He has been doing Museum/gallery exhibitions officer and probiotics.  No fever, chills, SOB.   His blood pressure has  been controlled at home, he is not on norvasc since Jan, today their BP is BP: 126/72 He does workout, walking and weight lifting. He denies chest pain, shortness of breath, dizziness.  He is not on cholesterol medication, he has been working on diet and exercise.Marland Kitchen No family history of heart disease and smoke. His cholesterol is not at goal. The cholesterol last visit was:   Lab Results  Component Value Date   CHOL 234 (H) 01/14/2019   HDL 50 01/14/2019   LDLCALC 168 (H) 01/14/2019   TRIG 69 01/14/2019   CHOLHDL 4.7 01/14/2019   Last GFR: Lab Results  Component Value Date   GFRNONAA 107 01/14/2019    Patient is on Vitamin D supplement.   Lab Results  Component Value Date   VD25OH 37 01/14/2019      Current Medications:  Current Outpatient Medications on File Prior to Visit  Medication Sig Dispense Refill   Vitamin D, Ergocalciferol, (DRISDOL) 1.25 MG (50000 UT) CAPS capsule 1 pill 3 days a week for vitamin d deficiency 36 capsule 1   amLODipine (NORVASC) 5 MG tablet Take 1 tablet (5 mg total) by mouth daily. 90 tablet 3   No current facility-administered medications on file prior to visit.   Allergies:  No Known Allergies   Health Maintenance:  Tetanus: 2018 Pneumovax: Prevnar 13: Flu vaccine: Zostavax: COVID discussed  Declines STD screening  DEXA: Colonoscopy: EGD: Eye Exam: Dentist:  Patient Care Team: Lucky Cowboy, MD as PCP - General (Internal Medicine)  Medical History:  has Back pain; Mixed hyperlipidemia; Hypertension; and Vitamin D deficiency on their problem list. Surgical History:  He  has no past surgical history on file. Family History:  His family history includes Asthma in his brother and mother; Bipolar disorder in his sister; Scoliosis in his sister. Social History:   reports that he has never smoked. He has never used smokeless tobacco. He reports that he does not drink alcohol and does not use drugs.   Review of Systems:  Review  of Systems  Constitutional: Positive for malaise/fatigue. Negative for chills, diaphoresis, fever and weight loss.  HENT: Negative.   Eyes: Negative.   Respiratory: Negative.  Negative for shortness of breath.   Cardiovascular: Negative.  Negative for chest pain, palpitations, claudication and leg swelling.  Gastrointestinal: Positive for heartburn. Negative for abdominal pain, blood in stool, constipation, diarrhea, melena, nausea and vomiting.  Genitourinary: Negative.   Musculoskeletal: Positive for back pain and joint pain. Negative for falls, myalgias and neck pain.  Skin: Negative.  Negative for rash.  Neurological: Positive for dizziness. Negative for tingling, tremors, sensory change, speech change, focal weakness, seizures, loss of consciousness, weakness and headaches.  Psychiatric/Behavioral: Negative for depression, hallucinations, memory loss, substance abuse and suicidal ideas. The patient is nervous/anxious and has insomnia.     Physical Exam: Estimated  body mass index is 28.75 kg/m as calculated from the following:   Height as of this encounter: 6' (1.829 m).   Weight as of this encounter: 212 lb (96.2 kg). BP 126/72    Pulse 74    Temp 97.7 F (36.5 C)    Ht 6' (1.829 m)    Wt (!) 212 lb (96.2 kg)    SpO2 97%    BMI 28.75 kg/m  General Appearance: Well nourished, in no apparent distress.  Eyes: PERRLA, EOMs, conjunctiva no swelling or erythema, normal fundi and vessels.  Sinuses: No Frontal/maxillary tenderness  ENT/Mouth: Ext aud canals clear, normal light reflex with TMs without erythema, bulging. Good dentition. No erythema, swelling, or exudate on post pharynx. Tonsils not swollen or erythematous. Hearing normal.  Neck: Supple, thyroid normal. No bruits  Respiratory: Respiratory effort normal, BS equal bilaterally without rales, rhonchi, wheezing or stridor.  Cardio: RRR without murmurs, rubs or gallops. Brisk peripheral pulses without edema.  Chest: symmetric, with  normal excursions and percussion.  Abdomen: Soft, nontender, no guarding, rebound, hernias, masses, or organomegaly.  Lymphatics: Non tender without lymphadenopathy.  Genitourinary: defer Musculoskeletal: Full ROM all peripheral extremities,5/5 strength, and normal gait.  Skin: Warm, dry without rashes, lesions, ecchymosis. Neuro: Cranial nerves intact, reflexes equal bilaterally. Normal muscle tone, no cerebellar symptoms. Sensation intact.  Psych: Awake and oriented X 3, normal affect, Insight and Judgment appropriate.   EKG: WNL no changes. AORTA SCAN: defer  George Clements 9:02 AM [facility]

## 2020-01-19 ENCOUNTER — Encounter: Payer: Self-pay | Admitting: Physician Assistant

## 2020-01-19 ENCOUNTER — Other Ambulatory Visit: Payer: Self-pay

## 2020-01-19 ENCOUNTER — Ambulatory Visit (INDEPENDENT_AMBULATORY_CARE_PROVIDER_SITE_OTHER): Payer: 59 | Admitting: Physician Assistant

## 2020-01-19 VITALS — BP 126/72 | HR 74 | Temp 97.7°F | Ht 72.0 in | Wt 212.0 lb

## 2020-01-19 DIAGNOSIS — M545 Low back pain, unspecified: Secondary | ICD-10-CM

## 2020-01-19 DIAGNOSIS — F419 Anxiety disorder, unspecified: Secondary | ICD-10-CM

## 2020-01-19 DIAGNOSIS — Z136 Encounter for screening for cardiovascular disorders: Secondary | ICD-10-CM

## 2020-01-19 DIAGNOSIS — G4700 Insomnia, unspecified: Secondary | ICD-10-CM

## 2020-01-19 DIAGNOSIS — E782 Mixed hyperlipidemia: Secondary | ICD-10-CM

## 2020-01-19 DIAGNOSIS — G8929 Other chronic pain: Secondary | ICD-10-CM

## 2020-01-19 DIAGNOSIS — Z79899 Other long term (current) drug therapy: Secondary | ICD-10-CM

## 2020-01-19 DIAGNOSIS — I10 Essential (primary) hypertension: Secondary | ICD-10-CM | POA: Diagnosis not present

## 2020-01-19 DIAGNOSIS — Z0000 Encounter for general adult medical examination without abnormal findings: Secondary | ICD-10-CM

## 2020-01-19 DIAGNOSIS — K219 Gastro-esophageal reflux disease without esophagitis: Secondary | ICD-10-CM

## 2020-01-19 DIAGNOSIS — Z0001 Encounter for general adult medical examination with abnormal findings: Secondary | ICD-10-CM

## 2020-01-19 DIAGNOSIS — E559 Vitamin D deficiency, unspecified: Secondary | ICD-10-CM

## 2020-01-19 DIAGNOSIS — G4753 Recurrent isolated sleep paralysis: Secondary | ICD-10-CM

## 2020-01-19 DIAGNOSIS — R42 Dizziness and giddiness: Secondary | ICD-10-CM

## 2020-01-19 MED ORDER — OMEPRAZOLE 40 MG PO CPDR: 40.0000 mg | DELAYED_RELEASE_CAPSULE | Freq: Every day | ORAL | 3 refills | Status: DC

## 2020-01-19 NOTE — Patient Instructions (Addendum)
   Take omeprazole over the counter for 2 weeks, then go to zantac 150-300 mg OR pepcid 20 or 40mg  at night for 2 weeks, then you can stop or continue as needed.  Avoid alcohol, spicy foods, NSAIDS (aleve, ibuprofen) at this time. See foods below.   Food Choices for Gastroesophageal Reflux Disease When you have gastroesophageal reflux disease (GERD), the foods you eat and your eating habits are very important. Choosing the right foods can help ease the discomfort of GERD. WHAT GENERAL GUIDELINES DO I NEED TO FOLLOW?  Choose fruits, vegetables, whole grains, low-fat dairy products, and low-fat meat, fish, and poultry.  Limit fats such as oils, salad dressings, butter, nuts, and avocado.  Keep a food diary to identify foods that cause symptoms.  Avoid foods that cause reflux. These may be different for different people.  Eat frequent small meals instead of three large meals each day.  Eat your meals slowly, in a relaxed setting.  Limit fried foods.  Cook foods using methods other than frying.  Avoid drinking alcohol.  Avoid drinking large amounts of liquids with your meals.  Avoid bending over or lying down until 2-3 hours after eating. WHAT FOODS ARE NOT RECOMMENDED? The following are some foods and drinks that may worsen your symptoms: Vegetables Tomatoes. Tomato juice. Tomato and spaghetti sauce. Chili peppers. Onion and garlic. Horseradish. Fruits Oranges, grapefruit, and lemon (fruit and juice). Meats High-fat meats, fish, and poultry. This includes hot dogs, ribs, ham, sausage, salami, and bacon. Dairy Whole milk and chocolate milk. Sour cream. Cream. Butter. Ice cream. Cream cheese.  Beverages Coffee and tea, with or without caffeine. Carbonated beverages or energy drinks. Condiments Hot sauce. Barbecue sauce.  Sweets/Desserts Chocolate and cocoa. Donuts. Peppermint and spearmint. Fats and Oils High-fat foods, including fries and potato chips. Other Vinegar.  Strong spices, such as black pepper, white pepper, red pepper, cayenne, curry powder, cloves, ginger, and chili powder.  GENERAL HEALTH GOALS  Know what a healthy weight is for you (roughly BMI <25) and aim to maintain this  Aim for 7+ servings of fruits and vegetables daily  70-80+ fluid ounces of water or unsweet tea for healthy kidneys  Limit to max 1 drink of alcohol per day; avoid smoking/tobacco  Limit animal fats in diet for cholesterol and heart health - choose grass fed whenever available  Avoid highly processed foods, and foods high in saturated/trans fats  Aim for low stress - take time to unwind and care for your mental health  Aim for 150 min of moderate intensity exercise weekly for heart health, and weights twice weekly for bone health  Aim for 7-9 hours of sleep daily

## 2020-01-25 LAB — CBC WITH DIFFERENTIAL/PLATELET
Absolute Monocytes: 720 cells/uL (ref 200–950)
Basophils Absolute: 43 cells/uL (ref 0–200)
Basophils Relative: 0.6 %
Eosinophils Absolute: 29 cells/uL (ref 15–500)
Eosinophils Relative: 0.4 %
HCT: 47 % (ref 38.5–50.0)
Hemoglobin: 15.7 g/dL (ref 13.2–17.1)
Lymphs Abs: 1332 cells/uL (ref 850–3900)
MCH: 30.7 pg (ref 27.0–33.0)
MCHC: 33.4 g/dL (ref 32.0–36.0)
MCV: 91.8 fL (ref 80.0–100.0)
MPV: 10.7 fL (ref 7.5–12.5)
Monocytes Relative: 10 %
Neutro Abs: 5076 cells/uL (ref 1500–7800)
Neutrophils Relative %: 70.5 %
Platelets: 275 10*3/uL (ref 140–400)
RBC: 5.12 10*6/uL (ref 4.20–5.80)
RDW: 12.5 % (ref 11.0–15.0)
Total Lymphocyte: 18.5 %
WBC: 7.2 10*3/uL (ref 3.8–10.8)

## 2020-01-25 LAB — CELIAC DISEASE COMPREHENSIVE PANEL WITH REFLEXES
(tTG) Ab, IgA: 1 U/mL
Immunoglobulin A: <5 mg/dL — ABNORMAL LOW (ref 47–310)

## 2020-01-25 LAB — H. PYLORI BREATH TEST: H. pylori Breath Test: NOT DETECTED

## 2020-01-25 LAB — COMPLETE METABOLIC PANEL WITH GFR
AG Ratio: 1.6 (calc) (ref 1.0–2.5)
ALT: 18 U/L (ref 9–46)
AST: 16 U/L (ref 10–40)
Albumin: 4.6 g/dL (ref 3.6–5.1)
Alkaline phosphatase (APISO): 68 U/L (ref 36–130)
BUN: 14 mg/dL (ref 7–25)
CO2: 26 mmol/L (ref 20–32)
Calcium: 9.8 mg/dL (ref 8.6–10.3)
Chloride: 104 mmol/L (ref 98–110)
Creat: 1.06 mg/dL (ref 0.60–1.35)
GFR, Est African American: 110 mL/min/{1.73_m2} (ref >=60)
GFR, Est Non African American: 95 mL/min/{1.73_m2} (ref >=60)
Globulin: 2.9 g/dL (calc) (ref 1.9–3.7)
Glucose, Bld: 95 mg/dL (ref 65–99)
Potassium: 4.5 mmol/L (ref 3.5–5.3)
Sodium: 138 mmol/L (ref 135–146)
Total Bilirubin: 0.6 mg/dL (ref 0.2–1.2)
Total Protein: 7.5 g/dL (ref 6.1–8.1)

## 2020-01-25 LAB — VITAMIN D 25 HYDROXY (VIT D DEFICIENCY, FRACTURES): Vit D, 25-Hydroxy: 32 ng/mL (ref 30–100)

## 2020-01-25 LAB — MAGNESIUM: Magnesium: 2.1 mg/dL (ref 1.5–2.5)

## 2020-01-25 LAB — LIPID PANEL
Cholesterol: 232 mg/dL — ABNORMAL HIGH (ref <200)
HDL: 48 mg/dL (ref >=40)
LDL Cholesterol (Calc): 158 mg/dL (calc) — ABNORMAL HIGH
Non-HDL Cholesterol (Calc): 184 mg/dL (calc) — ABNORMAL HIGH (ref <130)
Total CHOL/HDL Ratio: 4.8 (calc) (ref <5.0)
Triglycerides: 133 mg/dL (ref <150)

## 2020-01-25 LAB — TSH: TSH: 1.6 mIU/L (ref 0.40–4.50)

## 2020-01-25 LAB — HEMOGLOBIN A1C
Hgb A1c MFr Bld: 5.2 % of total Hgb (ref <5.7)
Mean Plasma Glucose: 103 (calc)
eAG (mmol/L): 5.7 (calc)

## 2020-01-25 LAB — TISSUE TRANSGLUTAMINASE, IGG: (tTG) Ab, IgG: 2 U/mL

## 2020-01-25 LAB — INSULIN, RANDOM: Insulin: 5.7 u[IU]/mL

## 2020-03-06 ENCOUNTER — Telehealth: Payer: Self-pay | Admitting: Physician Assistant

## 2020-03-06 DIAGNOSIS — E782 Mixed hyperlipidemia: Secondary | ICD-10-CM

## 2020-03-06 DIAGNOSIS — R42 Dizziness and giddiness: Secondary | ICD-10-CM

## 2020-03-06 MED ORDER — ROSUVASTATIN CALCIUM 5 MG PO TABS
5.0000 mg | ORAL_TABLET | Freq: Every day | ORAL | 1 refills | Status: DC
Start: 2020-03-06 — End: 2020-10-02

## 2020-03-06 NOTE — Telephone Encounter (Signed)
Patient continues to have post pranial dizziness- will last up to an hour. He had negative celiac, normal B12, iron, electrolytes, no diabetes. He will feel faint/dizzy with standing or even sitting after eating. No pain/pressure/GI symptoms.   Measure BP after eating. Preload food with a lot of water, eat small frequent meals, we will check insulin sensitivity/glucose tolerance test.   He was also interested in starting on cholesterol medication. Will start on low dose crestor and check chol and LFTs at glucose test as well.  Lab Results  Component Value Date   CHOL 232 (H) 01/19/2020   HDL 48 01/19/2020   LDLCALC 158 (H) 01/19/2020   TRIG 133 01/19/2020   CHOLHDL 4.8 01/19/2020

## 2020-05-24 ENCOUNTER — Ambulatory Visit (INDEPENDENT_AMBULATORY_CARE_PROVIDER_SITE_OTHER): Payer: 59 | Admitting: Adult Health

## 2020-05-24 ENCOUNTER — Encounter: Payer: Self-pay | Admitting: Adult Health

## 2020-05-24 ENCOUNTER — Other Ambulatory Visit: Payer: Self-pay

## 2020-05-24 VITALS — BP 140/90 | HR 97 | Temp 97.5°F | Wt 204.0 lb

## 2020-05-24 DIAGNOSIS — R6881 Early satiety: Secondary | ICD-10-CM

## 2020-05-24 DIAGNOSIS — R42 Dizziness and giddiness: Secondary | ICD-10-CM

## 2020-05-24 DIAGNOSIS — R195 Other fecal abnormalities: Secondary | ICD-10-CM | POA: Diagnosis not present

## 2020-05-24 NOTE — Progress Notes (Addendum)
Assessment and Plan:  George Clements was seen today for gi problem.  Diagnoses and all orders for this visit:  Early satiety Pencilling of stools Episode of dizziness Patient has several GI concerns of unclear etiology including persistent unexplained postprandial dizziness/near syncope with unremarkable workup per below Glucose tolerance test was discussed by previous provider but never completed, discussed today but patient prefers to defer at this time;  Some new ? Early satiety, possible slow motility or element of gastroparesis or hiatal hernia however no improvement with PPI Also 3 months of persistent penciling of stools - without improvement on stools softeners At this point I feel he would most benefit from specialist input due to persistent and evolving features, consideration for gastric emptying or other specialty diagnostic test Discussed trial of reglan/motility agent, but patient prefers to defer and proceed with GI evaluation Referral placed  Further disposition pending results of labs. Discussed med's effects and SE's.   Over 30 minutes of exam, counseling, chart review, and critical decision making was performed.   Future Appointments  Date Time Provider Department Center  01/18/2021  9:00 AM McClanahan, Bella Kennedy, NP [facility] None   ------------------------------------------------------------------------------------------------------------------  HPI BP 140/90   Pulse 97   Temp (!) 97.5 F (36.4 C)   Wt 204 lb (92.5 kg)   SpO2 97%   BMI 27.67 kg/m   29 y.o.male with hx of htn, GERD who works in [employer] presents for evaluation of persistent unexplained/evolving abdominal sx.   He reports sx began gradually around 11 months ago. He started noting intermittently after eating would experience episodes of lightheadedness, sense of near syncope, occasionally with sense of palpitation though without though without actual syncopal episodes. Sensation would  last approximately 20 min then resolve spontaneously. He denied NSAID, significant ETOH, no new meds/supplements. Denies HA or diaphoresis.   He had unremarkable workup including CBC, CMP, TSH, A1C/insulin, h. Pylori, celiac panel, tissue transglutaminase, IgG on 01/19/2020. Quentin Mulling, PA discussed glucose tolerance test in Sept 2021 but patient never presented for this. He was also initiated on omeprazole 40 mg daily.   Today he initially presented questioning need for possible food sensitivity test; however, he states has trialed extensive elimination diet including very low carbohydrate, meat, salads only, and has noted no difference in episodes. He has noted that larger meals cause worse episodes, has noted improvement with smaller regular meals.   He states did complete course of omeprazole without significant improvement; previous reflux appears to have resolved with lifestyle modification and avoiding trigger (caffeine).   Today he reports 3 months of persistent feeling of getting full quickly, unable to eat nearly as much as he was able to just 6 months ago, sense of lack of motility, and persistently narrowed "slightly larger than a pencil" very hard stools in pieces. Previously having 2 easy BMs daily, recently 1 BM. No improvement with miralax. This is persistent. Not alternating with looser stools or diarrhea. Denies abdominal pain, nausea, blood in stool, dark/tarry stools, mucus, belching, bloating, unintended weight loss, fatigue.   BMI is Body mass index is 27.67 kg/m., he has been working on diet and exercise. Wt Readings from Last 3 Encounters:  05/24/20 204 lb (92.5 kg)  01/19/20 (!) 212 lb (96.2 kg)  01/14/19 207 lb 9.6 oz (94.2 kg)    Surgical History:  He  has a past surgical history that includes No past surgeries. Family History:  Hisfamily history includes Asthma in his brother and mother; Bipolar disorder in his  sister; Cancer in his paternal grandfather; Drug abuse  in his maternal grandfather; Heart murmur in his sister; Melanoma (age of onset: 75) in his maternal aunt; Pancreatic cancer (age of onset: 68) in his paternal grandmother; Scoliosis in his sister. Social History:   reports that he has never smoked. He has never used smokeless tobacco. He reports current alcohol use. He reports that he does not use drugs.   Past Medical History:  Diagnosis Date  . Acid reflux      No Known Allergies  Current Outpatient Medications on File Prior to Visit  Medication Sig  . ELDERBERRY PO Take by mouth daily.  . Wheat Dextrin (BENEFIBER PO) Take by mouth daily.  Marland Kitchen amLODipine (NORVASC) 5 MG tablet Take 1 tablet (5 mg total) by mouth daily.  Marland Kitchen omeprazole (PRILOSEC) 40 MG capsule Take 1 capsule (40 mg total) by mouth daily. (Patient not taking: Reported on 05/24/2020)  . rosuvastatin (CRESTOR) 5 MG tablet Take 1 tablet (5 mg total) by mouth daily. (Patient not taking: Reported on 05/24/2020)  . Vitamin D, Ergocalciferol, (DRISDOL) 1.25 MG (50000 UT) CAPS capsule 1 pill 3 days a week for vitamin d deficiency   No current facility-administered medications on file prior to visit.    ROS: all negative except above.   Physical Exam:  BP 140/90   Pulse 97   Temp (!) 97.5 F (36.4 C)   Wt 204 lb (92.5 kg)   SpO2 97%   BMI 27.67 kg/m   General Appearance: Well nourished, well dressed young adult male in no apparent distress. Eyes: PERRLA, conjunctiva no swelling or erythema ENT/Mouth: No erythema, swelling, or exudate on post pharynx.  Tonsils not swollen or erythematous. Hearing normal.  Neck: Supple, thyroid normal.  Respiratory: Respiratory effort normal, BS equal bilaterally without rales, rhonchi, wheezing or stridor.  Cardio: RRR with no MRGs. Brisk peripheral pulses without edema.  Abdomen: soft, non-distended, +BS.  Non tender, no guarding, rebound, hernias, mobile firmness to left deep abdomen, ? Stool burden Rectal: patient declined Lymphatics:  Non tender without lymphadenopathy.  Musculoskeletal: normal gait.  Skin: Warm, dry without rashes, lesions, ecchymosis.  Neuro: Normal muscle tone Psych: Awake and oriented X 3, normal affect, Insight and Judgment appropriate.     Dan Maker, NP 7:56 PM [facility]

## 2020-07-19 ENCOUNTER — Other Ambulatory Visit: Payer: Self-pay | Admitting: Hematology and Oncology

## 2020-07-19 ENCOUNTER — Other Ambulatory Visit: Payer: Self-pay | Admitting: Physician Assistant

## 2020-07-19 ENCOUNTER — Ambulatory Visit
Admission: RE | Admit: 2020-07-19 | Discharge: 2020-07-19 | Disposition: A | Discharge status: Home / Self Care | Payer: 59 | Source: Ambulatory Visit | Attending: Physician Assistant | Admitting: Physician Assistant

## 2020-07-19 DIAGNOSIS — K5900 Constipation, unspecified: Secondary | ICD-10-CM

## 2020-09-27 ENCOUNTER — Encounter (INDEPENDENT_AMBULATORY_CARE_PROVIDER_SITE_OTHER): Payer: Self-pay

## 2020-09-29 NOTE — Progress Notes (Signed)
Assessment and Plan:  George Clements was seen today for snoring and insomnia.  Diagnoses and all orders for this visit:  Insomnia, unspecified type - good sleep hygiene discussed, increase day time activity - Anxiety may be contributing - Stress management techniques discussed, increase water, good sleep hygiene discussed, increase exercise, and increase veggies. Consider CBT -     Limited benefit with benadryl; will try trazodone for possible anxiety benefit as well; start low dose and taper every 2-3 days as needed -    Follow up 1 month, call the office if any new AE's from medications and we will switch them -     traZODone (DESYREL) 50 MG tablet; 1/2-2 tablet as needed at night for sleep.  Anxiety Per patient improved; managing with lifestyle Discussed this could contribute to sleep issues; consider CBT Stress management techniques discussed, increase water, good sleep hygiene discussed, increase exercise, and increase veggies.   Loud snoring With intermittent AM headache, non-restorative sleep, will refer for sleep study to r/o OSA -     Ambulatory referral to Neurology  Further disposition pending results of labs. Discussed med's effects and SE's.   Over 30 minutes of exam, counseling, chart review, and critical decision making was performed.   Future Appointments  Date Time Provider Department Center  01/19/2021  9:00 AM Judd Gaudier, NP [facility] None    ------------------------------------------------------------------------------------------------------------------   HPI BP 132/74   Pulse (!) 108   Temp 97.7 F (36.5 C)   Ht 6' (1.829 m)   Wt 201 lb (91.2 kg)   SpO2 96%   BMI 27.26 kg/m   29 y.o.male presents for evaluation of chronic snoring and sleep issues.   He reports since high school has been advised very loud snoring, room mates complain, is unsure of apneic episodes. Parents both have similar but have never had sleep studies. He has had intermittent  occipital headache in AM, sore throat, dry mouth, will frequently feel non-restorative sleep. Has been using sleep app, shows very little deep sleep and was concerned. 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   In the last month he has reported frequent awakening intermittently, some nights every 15-120 min, no specific trigger, worked on sleep hygiene and tried benadryl without significant improvement. Discussed trying sleep med today. Would prefer to avoid agents with risk for tolerance.   Last year transitioned job from typical 5 day work week to 12 hour shifts, but had been managing fairly until recently. He does report hx of anxiety, was worse with high caffeine, has reduced significantly, trying to work on diet/exercise/lifestyle, does breathing exercises, does feel this is fairly well controlled and prefers to avoid medication. Avoids alcohol, denies drug use. Has reduced screen use in the evening, keeps rooms cool, no particular awakening trigger. Also doing yoga and meditation regularly with perceived benefit for mood.    Lab Results  Component Value Date   TSH 1.60 01/19/2020     Past Medical History:  Diagnosis Date  . Acid reflux   . Hyperlipidemia   . Hypertension      No Known Allergies  Current Outpatient Medications on File Prior to Visit  Medication Sig  . ELDERBERRY PO Take by mouth daily.  . Loratadine-Pseudoephedrine (CLARITIN-D 24 HOUR PO) Take by mouth daily.  . Wheat Dextrin (BENEFIBER PO) Take by mouth daily.  Marland Kitchen amLODipine (NORVASC) 5 MG tablet Take 1 tablet (5 mg total) by mouth daily.  Marland Kitchen  omeprazole (PRILOSEC) 40 MG capsule Take 1 capsule (40 mg total) by mouth daily. (Patient not taking: Reported on 05/24/2020)  . rosuvastatin (CRESTOR) 5 MG tablet Take 1 tablet (5 mg total) by mouth daily. (Patient not taking: Reported on 05/24/2020)  . Vitamin D, Ergocalciferol, (DRISDOL) 1.25 MG (50000 UT) CAPS capsule 1  pill 3 days a week for vitamin d deficiency   No current facility-administered medications on file prior to visit.    ROS: all negative except above.   Physical Exam:  BP 132/74   Pulse (!) 108   Temp 97.7 F (36.5 C)   Ht 6' (1.829 m)   Wt 201 lb (91.2 kg)   SpO2 96%   BMI 27.26 kg/m   General Appearance: Well nourished, in no apparent distress. Eyes: PERRLA, EOMs, conjunctiva no swelling or erythema Sinuses: No Frontal/maxillary tenderness ENT/Mouth: Ext aud canals clear, TMs without erythema, bulging. Noerythema, swelling, or exudate on post pharynx. Tonsils 2+ bil, narry oropharynx.Tonsils not swollen or erythematous. Nares clear, nasal septum not significantly deviated. Hearing normal.  Neck: Supple, thyroid normal.  Respiratory: Respiratory effort normal, BS equal bilaterally without rales, rhonchi, wheezing or stridor.  Cardio: RRR with no MRGs.  Lymphatics: Non tender without lymphadenopathy.  Musculoskeletal:  normal gait.  Skin: Warm, dry without rashes, lesions, ecchymosis.  Neuro: Cranial nerves intact. Normal muscle tone, no cerebellar symptoms. Sensation intact.  Psych: Awake and oriented X 3, mildly anxious affect, fidgiting, Insight and Judgment appropriate.     Dan Maker, NP 12:26 PM [facility],[place]

## 2020-10-02 ENCOUNTER — Encounter: Payer: Self-pay | Admitting: Adult Health

## 2020-10-02 ENCOUNTER — Ambulatory Visit (INDEPENDENT_AMBULATORY_CARE_PROVIDER_SITE_OTHER): Payer: 59 | Admitting: Adult Health

## 2020-10-02 ENCOUNTER — Other Ambulatory Visit: Payer: Self-pay

## 2020-10-02 VITALS — BP 132/74 | HR 108 | Temp 97.7°F | Ht 72.0 in | Wt 201.0 lb

## 2020-10-02 DIAGNOSIS — F419 Anxiety disorder, unspecified: Secondary | ICD-10-CM | POA: Diagnosis not present

## 2020-10-02 DIAGNOSIS — R0683 Snoring: Secondary | ICD-10-CM

## 2020-10-02 DIAGNOSIS — G4700 Insomnia, unspecified: Secondary | ICD-10-CM | POA: Diagnosis not present

## 2020-10-02 MED ORDER — TRAZODONE HCL 50 MG PO TABS: ORAL_TABLET | ORAL | 0 refills | Status: DC

## 2020-10-02 NOTE — Patient Instructions (Addendum)
Trazodone Tablets What is this medicine? TRAZODONE (TRAZ oh done) is used to treat depression. This medicine may be used for other purposes; ask your health care provider or pharmacist if you have questions. COMMON BRAND NAME(S): Desyrel What should I tell my health care provider before I take this medicine? They need to know if you have any of these conditions:  attempted suicide or thinking about it  bipolar disorder  bleeding problems  glaucoma  heart disease, or previous heart attack  irregular heart beat  kidney or liver disease  low levels of sodium in the blood  an unusual or allergic reaction to trazodone, other medicines, foods, dyes or preservatives  pregnant or trying to get pregnant  breast-feeding How should I use this medicine? Take this medicine by mouth with a glass of water. Follow the directions on the prescription label. Take this medicine shortly after a meal or a light snack. Take your medicine at regular intervals. Do not take your medicine more often than directed. Do not stop taking this medicine suddenly except upon the advice of your doctor. Stopping this medicine too quickly may cause serious side effects or your condition may worsen. A special MedGuide will be given to you by the pharmacist with each prescription and refill. Be sure to read this information carefully each time. Talk to your pediatrician regarding the use of this medicine in children. Special care may be needed. Overdosage: If you think you have taken too much of this medicine contact a poison control center or emergency room at once. NOTE: This medicine is only for you. Do not share this medicine with others. What if I miss a dose? If you miss a dose, take it as soon as you can. If it is almost time for your next dose, take only that dose. Do not take double or extra doses. What may interact with this medicine? Do not take this medicine with any of the following  medications:  certain medicines for fungal infections like fluconazole, itraconazole, ketoconazole, posaconazole, voriconazole  cisapride  dronedarone  linezolid  MAOIs like Carbex, Eldepryl, Marplan, Nardil, and Parnate  mesoridazine  methylene blue (injected into a vein)  pimozide  saquinavir  thioridazine This medicine may also interact with the following medications:  alcohol  antiviral medicines for HIV or AIDS  aspirin and aspirin-like medicines  barbiturates like phenobarbital  certain medicines for blood pressure, heart disease, irregular heart beat  certain medicines for depression, anxiety, or psychotic disturbances  certain medicines for migraine headache like almotriptan, eletriptan, frovatriptan, naratriptan, rizatriptan, sumatriptan, zolmitriptan  certain medicines for seizures like carbamazepine and phenytoin  certain medicines for sleep  certain medicines that treat or prevent blood clots like dalteparin, enoxaparin, warfarin  digoxin  fentanyl  lithium  NSAIDS, medicines for pain and inflammation, like ibuprofen or naproxen  other medicines that prolong the QT interval (cause an abnormal heart rhythm) like dofetilide  rasagiline  supplements like St. John's wort, kava kava, valerian  tramadol  tryptophan This list may not describe all possible interactions. Give your health care provider a list of all the medicines, herbs, non-prescription drugs, or dietary supplements you use. Also tell them if you smoke, drink alcohol, or use illegal drugs. Some items may interact with your medicine. What should I watch for while using this medicine? Tell your doctor if your symptoms do not get better or if they get worse. Visit your doctor or health care professional for regular checks on your progress. Because it may take   several weeks to see the full effects of this medicine, it is important to continue your treatment as prescribed by your  doctor. Patients and their families should watch out for new or worsening thoughts of suicide or depression. Also watch out for sudden changes in feelings such as feeling anxious, agitated, panicky, irritable, hostile, aggressive, impulsive, severely restless, overly excited and hyperactive, or not being able to sleep. If this happens, especially at the beginning of treatment or after a change in dose, call your health care professional. You may get drowsy or dizzy. Do not drive, use machinery, or do anything that needs mental alertness until you know how this medicine affects you. Do not stand or sit up quickly, especially if you are an older patient. This reduces the risk of dizzy or fainting spells. Alcohol may interfere with the effect of this medicine. Avoid alcoholic drinks. This medicine may cause dry eyes and blurred vision. If you wear contact lenses you may feel some discomfort. Lubricating drops may help. See your eye doctor if the problem does not go away or is severe. Your mouth may get dry. Chewing sugarless gum, sucking hard candy and drinking plenty of water may help. Contact your doctor if the problem does not go away or is severe. What side effects may I notice from receiving this medicine? Side effects that you should report to your doctor or health care professional as soon as possible:  allergic reactions like skin rash, itching or hives, swelling of the face, lips, or tongue  elevated mood, decreased need for sleep, racing thoughts, impulsive behavior  confusion  fast, irregular heartbeat  feeling faint or lightheaded, falls  feeling agitated, angry, or irritable  loss of balance or coordination  painful or prolonged erections  restlessness, pacing, inability to keep still  suicidal thoughts or other mood changes  tremors  trouble sleeping  seizures  unusual bleeding or bruising Side effects that usually do not require medical attention (report to your doctor  or health care professional if they continue or are bothersome):  change in sex drive or performance  change in appetite or weight  constipation  headache  muscle aches or pains  nausea This list may not describe all possible side effects. Call your doctor for medical advice about side effects. You may report side effects to FDA at 1-800-FDA-1088. Where should I keep my medicine? Keep out of the reach of children. Store at room temperature between 15 and 30 degrees C (59 to 86 degrees F). Protect from light. Keep container tightly closed. Throw away any unused medicine after the expiration date. NOTE: This sheet is a summary. It may not cover all possible information. If you have questions about this medicine, talk to your doctor, pharmacist, or health care provider.  2021 Elsevier/Gold Standard (2020-05-01 14:46:11)  

## 2020-10-19 ENCOUNTER — Other Ambulatory Visit: Payer: Self-pay | Admitting: Adult Health

## 2020-10-19 DIAGNOSIS — K219 Gastro-esophageal reflux disease without esophagitis: Secondary | ICD-10-CM | POA: Insufficient documentation

## 2020-10-19 MED ORDER — FAMOTIDINE 20 MG PO TABS: ORAL_TABLET | ORAL | 2 refills | Status: AC

## 2020-11-22 ENCOUNTER — Other Ambulatory Visit: Payer: Self-pay

## 2020-11-22 ENCOUNTER — Encounter: Payer: Self-pay | Admitting: Neurology

## 2020-11-22 ENCOUNTER — Ambulatory Visit (INDEPENDENT_AMBULATORY_CARE_PROVIDER_SITE_OTHER): Payer: 59 | Admitting: Neurology

## 2020-11-22 VITALS — BP 149/90 | HR 73 | Ht 72.0 in | Wt 204.0 lb

## 2020-11-22 DIAGNOSIS — G4726 Circadian rhythm sleep disorder, shift work type: Secondary | ICD-10-CM

## 2020-11-22 DIAGNOSIS — R0689 Other abnormalities of breathing: Secondary | ICD-10-CM

## 2020-11-22 DIAGNOSIS — G478 Other sleep disorders: Secondary | ICD-10-CM

## 2020-11-22 DIAGNOSIS — R0683 Snoring: Secondary | ICD-10-CM
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
## 2020-11-22 NOTE — Patient Instructions (Signed)
Insomnia Insomnia is a sleep disorder that makes it difficult to fall asleep or stay asleep. Insomnia can cause fatigue, low energy, difficulty concentrating, mood swings, and poor performance at work or school. There are three different ways to classify insomnia:  Difficulty falling asleep.  Difficulty staying asleep.  Waking up too early in the morning. Any type of insomnia can be long-term (chronic) or short-term (acute). Both are common. Short-term insomnia usually lasts for three months or less. Chronic insomnia occurs at least three times a week for longer than three months. What are the causes? Insomnia may be caused by another condition, situation, or substance, such as:  Anxiety.  Certain medicines.  Gastroesophageal reflux disease (GERD) or other gastrointestinal conditions.  Asthma or other breathing conditions.  Restless legs syndrome, sleep apnea, or other sleep disorders.  Chronic pain.  Menopause.  Stroke.  Abuse of alcohol, tobacco, or illegal drugs.  Mental health conditions, such as depression.  Caffeine.  Neurological disorders, such as Alzheimer's disease.  An overactive thyroid (hyperthyroidism). Sometimes, the cause of insomnia may not be known. What increases the risk? Risk factors for insomnia include:  Gender. Women are affected more often than men.  Age. Insomnia is more common as you get older.  Stress.  Lack of exercise.  Irregular work schedule or working night shifts.  Traveling between different time zones.  Certain medical and mental health conditions. What are the signs or symptoms? If you have insomnia, the main symptom is having trouble falling asleep or having trouble staying asleep. This may lead to other symptoms, such as:  Feeling fatigued or having low energy.  Feeling nervous about going to sleep.  Not feeling rested in the morning.  Having trouble concentrating.  Feeling irritable, anxious, or depressed. How  is this diagnosed? This condition may be diagnosed based on:  Your symptoms and medical history. Your health care provider may ask about: ? Your sleep habits. ? Any medical conditions you have. ? Your mental health.  A physical exam. How is this treated? Treatment for insomnia depends on the cause. Treatment may focus on treating an underlying condition that is causing insomnia. Treatment may also include:  Medicines to help you sleep.  Counseling or therapy.  Lifestyle adjustments to help you sleep better. Follow these instructions at home: Eating and drinking  Limit or avoid alcohol, caffeinated beverages, and cigarettes, especially close to bedtime. These can disrupt your sleep.  Do not eat a large meal or eat spicy foods right before bedtime. This can lead to digestive discomfort that can make it hard for you to sleep.   Sleep habits  Keep a sleep diary to help you and your health care provider figure out what could be causing your insomnia. Write down: ? When you sleep. ? When you wake up during the night. ? How well you sleep. ? How rested you feel the next day. ? Any side effects of medicines you are taking. ? What you eat and drink.  Make your bedroom a dark, comfortable place where it is easy to fall asleep. ? Put up shades or blackout curtains to block light from outside. ? Use a white noise machine to block noise. ? Keep the temperature cool.  Limit screen use before bedtime. This includes: ? Watching TV. ? Using your smartphone, tablet, or computer.  Stick to a routine that includes going to bed and waking up at the same times every day and night. This can help you fall asleep faster. Consider   making a quiet activity, such as reading, part of your nighttime routine.  Try to avoid taking naps during the day so that you sleep better at night.  Get out of bed if you are still awake after 15 minutes of trying to sleep. Keep the lights down, but try reading or  doing a quiet activity. When you feel sleepy, go back to bed.   General instructions  Take over-the-counter and prescription medicines only as told by your health care provider.  Exercise regularly, as told by your health care provider. Avoid exercise starting several hours before bedtime.  Use relaxation techniques to manage stress. Ask your health care provider to suggest some techniques that may work well for you. These may include: ? Breathing exercises. ? Routines to release muscle tension. ? Visualizing peaceful scenes.  Make sure that you drive carefully. Avoid driving if you feel very sleepy.  Keep all follow-up visits as told by your health care provider. This is important. Contact a health care provider if:  You are tired throughout the day.  You have trouble in your daily routine due to sleepiness.  You continue to have sleep problems, or your sleep problems get worse. Get help right away if:  You have serious thoughts about hurting yourself or someone else. If you ever feel like you may hurt yourself or others, or have thoughts about taking your own life, get help right away. You can go to your nearest emergency department or call:  Your local emergency services (911 in the [place]  A suicide crisis helpline, such as the National Suicide Prevention Lifeline at (219)055-8711. This is open 24 hours a day. Summary  Insomnia is a sleep disorder that makes it difficult to fall asleep or stay asleep.  Insomnia can be long-term (chronic) or short-term (acute).  Treatment for insomnia depends on the cause. Treatment may focus on treating an underlying condition that is causing insomnia.  Keep a sleep diary to help you and your health care provider figure out what could be causing your insomnia. This information is not intended to replace advice given to you by your health care provider. Make sure you discuss any questions you have with your health care provider. Document  Revised: 04/20/2020 Document Reviewed: 04/20/2020 Elsevier Patient Education  2021 Elsevier Inc. Sleep Apnea Sleep apnea affects breathing during sleep. It causes breathing to stop for a short time or to become shallow. It can also increase the risk of:  Heart attack.  Stroke.  Being very overweight (obese).  Diabetes.  Heart failure.  Irregular heartbeat. The goal of treatment is to help you breathe normally again. What are the causes? There are three kinds of sleep apnea:  Obstructive sleep apnea. This is caused by a blocked or collapsed airway.  Central sleep apnea. This happens when the brain does not send the right signals to the muscles that control breathing.  Mixed sleep apnea. This is a combination of obstructive and central sleep apnea. The most common cause of this condition is a collapsed or blocked airway. This can happen if:  Your throat muscles are too relaxed.  Your tongue and tonsils are too large.  You are overweight.  Your airway is too small.   What increases the risk?  Being overweight.  Smoking.  Having a small airway.  Being older.  Being male.  Drinking alcohol.  Taking medicines to calm yourself (sedatives or tranquilizers).  Having family members with the condition. What are the signs or symptoms?  Trouble staying asleep.  Being sleepy or tired during the day.  Getting angry a lot.  Loud snoring.  Headaches in the morning.  Not being able to focus your mind (concentrate).  Forgetting things.  Less interest in sex.  Mood swings.  Personality changes.  Feelings of sadness (depression).  Waking up a lot during the night to pee (urinate).  Dry mouth.  Sore throat. How is this diagnosed?  Your medical history.  A physical exam.  A test that is done when you are sleeping (sleep study). The test is most often done in a sleep lab but may also be done at home. How is this treated?  Sleeping on your  side.  Using a medicine to get rid of mucus in your nose (decongestant).  Avoiding the use of alcohol, medicines to help you relax, or certain pain medicines (narcotics).  Losing weight, if needed.  Changing your diet.  Not smoking.  Using a machine to open your airway while you sleep, such as: ? An oral appliance. This is a mouthpiece that shifts your lower jaw forward. ? A CPAP device. This device blows air through a mask when you breathe out (exhale). ? An EPAP device. This has valves that you put in each nostril. ? A BPAP device. This device blows air through a mask when you breathe in (inhale) and breathe out.  Having surgery if other treatments do not work. It is important to get treatment for sleep apnea. Without treatment, it can lead to:  High blood pressure.  Coronary artery disease.  In men, not being able to have an erection (impotence).  Reduced thinking ability.   Follow these instructions at home: Lifestyle  Make changes that your doctor recommends.  Eat a healthy diet.  Lose weight if needed.  Avoid alcohol, medicines to help you relax, and some pain medicines.  Do not use any products that contain nicotine or tobacco, such as cigarettes, e-cigarettes, and chewing tobacco. If you need help quitting, ask your doctor. General instructions  Take over-the-counter and prescription medicines only as told by your doctor.  If you were given a machine to use while you sleep, use it only as told by your doctor.  If you are having surgery, make sure to tell your doctor you have sleep apnea. You may need to bring your device with you.  Keep all follow-up visits as told by your doctor. This is important. Contact a doctor if:  The machine that you were given to use during sleep bothers you or does not seem to be working.  You do not get better.  You get worse. Get help right away if:  Your chest hurts.  You have trouble breathing in enough air.  You  have an uncomfortable feeling in your back, arms, or stomach.  You have trouble talking.  One side of your body feels weak.  A part of your face is hanging down. These symptoms may be an emergency. Do not wait to see if the symptoms will go away. Get medical help right away. Call your local emergency services (911 in the [place] Do not drive yourself to the hospital. Summary  This condition affects breathing during sleep.  The most common cause is a collapsed or blocked airway.  The goal of treatment is to help you breathe normally while you sleep. This information is not intended to replace advice given to you by your health care provider. Make sure you discuss any questions you have with  your health care provider. Document Revised: 03/27/2018 Document Reviewed: 02/03/2018 Elsevier Patient Education  2021 Elsevier Inc. Quality Sleep Information, Adult Quality sleep is important for your mental and physical health. It also improves your quality of life. Quality sleep means you:  Are asleep for most of the time you are in bed.  Fall asleep within 30 minutes.  Wake up no more than once a night.  Are awake for no longer than 20 minutes if you do wake up during the night. Most adults need 7-8 hours of quality sleep each night. How can poor sleep affect me? If you do not get enough quality sleep, you may have:  Mood swings.  Daytime sleepiness.  Confusion.  Decreased reaction time.  Sleep disorders, such as insomnia and sleep apnea.  Difficulty with: ? Solving problems. ? Coping with stress. ? Paying attention. These issues may affect your performance and productivity at work, school, and at home. Lack of sleep may also put you at higher risk for accidents, suicide, and risky behaviors. If you do not get quality sleep you may also be at higher risk for several health problems, including:  Infections.  Type 2 diabetes.  Heart disease.  High blood  pressure.  Obesity.  Worsening of long-term conditions, like arthritis, kidney disease, depression, Parkinson's disease, and epilepsy. What actions can I take to get more quality sleep?  Stick to a sleep schedule. Go to sleep and wake up at about the same time each day. Do not try to sleep less on weekdays and make up for lost sleep on weekends. This does not work.  Try to get about 30 minutes of exercise on most days. Do not exercise 2-3 hours before going to bed.  Limit naps during the day to 30 minutes or less.  Do not use any products that contain nicotine or tobacco, such as cigarettes or e-cigarettes. If you need help quitting, ask your health care provider.  Do not drink caffeinated beverages for at least 8 hours before going to bed. Coffee, tea, and some sodas contain caffeine.  Do not drink alcohol close to bedtime.  Do not eat large meals close to bedtime.  Do not take naps in the late afternoon.  Try to get at least 30 minutes of sunlight every day. Morning sunlight is best.  Make time to relax before bed. Reading, listening to music, or taking a hot bath promotes quality sleep.  Make your bedroom a place that promotes quality sleep. Keep your bedroom dark, quiet, and at a comfortable room temperature. Make sure your bed is comfortable. Take out sleep distractions like TV, a computer, smartphone, and bright lights.  If you are lying awake in bed for longer than 20 minutes, get up and do a relaxing activity until you feel sleepy.  Work with your health care provider to treat medical conditions that may affect sleeping, such as: ? Nasal obstruction. ? Snoring. ? Sleep apnea and other sleep disorders.  Talk to your health care provider if you think any of your prescription medicines may cause you to have difficulty falling or staying asleep.  If you have sleep problems, talk with a sleep consultant. If you think you have a sleep disorder, talk with your health care  provider about getting evaluated by a specialist.      Where to find more information  National Sleep Foundation website: https://sleepfoundation.org  National Heart, Lung, and Blood Institute (NHLBI): https://hall.info/.pdf  Centers for Disease Control and Prevention (CDC): DetailSports.is Contact  a health care provider if you:  Have trouble getting to sleep or staying asleep.  Often wake up very early in the morning and cannot get back to sleep.  Have daytime sleepiness.  Have daytime sleep attacks of suddenly falling asleep and sudden muscle weakness (narcolepsy).  Have a tingling sensation in your legs with a strong urge to move your legs (restless legs syndrome).  Stop breathing briefly during sleep (sleep apnea).  Think you have a sleep disorder or are taking a medicine that is affecting your quality of sleep. Summary  Most adults need 7-8 hours of quality sleep each night.  Getting enough quality sleep is an important part of health and well-being.  Make your bedroom a place that promotes quality sleep and avoid things that may cause you to have poor sleep, such as alcohol, caffeine, smoking, and large meals.  Talk to your health care provider if you have trouble falling asleep or staying asleep. This information is not intended to replace advice given to you by your health care provider. Make sure you discuss any questions you have with your health care provider. Document Revised: 09/17/2017 Document Reviewed: 09/17/2017 Elsevier Patient Education  2021 [employer]

## 2020-12-13 ENCOUNTER — Ambulatory Visit (INDEPENDENT_AMBULATORY_CARE_PROVIDER_SITE_OTHER): Payer: 59 | Admitting: Neurology

## 2020-12-13 DIAGNOSIS — G4710 Hypersomnia, unspecified: Secondary | ICD-10-CM | POA: Diagnosis not present

## 2020-12-13 DIAGNOSIS — G4726 Circadian rhythm sleep disorder, shift work type: Secondary | ICD-10-CM

## 2020-12-13 DIAGNOSIS — R0689 Other abnormalities of breathing: Secondary | ICD-10-CM

## 2020-12-13 DIAGNOSIS — R0683 Snoring: Secondary | ICD-10-CM

## 2020-12-13 DIAGNOSIS — G478 Other sleep disorders: Secondary | ICD-10-CM

## 2020-12-14 NOTE — Progress Notes (Signed)
   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   Epworth sleepiness score: 8/24. BMI: 27.8 kg/m Neck Circumference: 17.5"   Sleep Summary:   Total Recording Time (hours, min): 8 h 42 min Total Sleep Time (hours, min):  8 h   Percent REM (%):    35.06%   Respiratory Indices:   Calculated pAHI (per hour):  3.3/hour        REM pAHI:    3.5/hour      NREM pAHI: 3.2/hour Supine AHI: 3.7/ hour  Oxygen Saturation Statistics:    Oxygen Saturation (%) Mean: 95%  Minimum oxygen saturation (%):    92%  O2 Saturation Range (%): 92-99%   O2 Saturation (minutes) <=88%:  0 min  Pulse Rate Statistics:   Pulse Mean (bpm):   64/min    Pulse Range (43-110/min)   IMPRESSION: This HST did not document any OSA (obstructive sleep apnea) to be present and there were no periods of clinically relevant hypoxia. Snoring was present, RDI was 6.5/h. Sleep architecture was normal for age and gender, with unfragmented sleep.   RECOMMENDATION: there is no need for apnea treatment/ intervention. 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      INTERPRETING PHYSICIAN:   INTERPRETING PHYSICIAN:  Melvyn Novas, MD    [facility] Board certified by The [employer] of [employer] and Diplomate of the [employer] of Sleep Medicine. Board certified In Neurology through the ABPN, Fellow of the [employer] of Neu[employer]. Medical Director of [employer].

## 2020-12-31 ENCOUNTER — Encounter: Payer: Self-pay | Admitting: Neurology

## 2020-12-31 NOTE — Progress Notes (Signed)
IMPRESSION: This HST did not document any OSA (obstructive sleep apnea) to be present and there were no periods of clinically relevant hypoxia. Snoring was present, RDI was 6.5/h. Sleep architecture was normal for age and gender, with unfragmented sleep.  RECOMMENDATION: there is no need for apnea treatment/ intervention. 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

## 2020-12-31 NOTE — Procedures (Signed)
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   Epworth sleepiness score: 8/24. BMI: 27.8 kg/m Neck Circumference: 17.5"   Sleep Summary:   Total Recording Time (hours, min): 8 h 42 min Total Sleep Time (hours, min):  8 h   Percent REM (%):    35.06%   Respiratory Indices:   Calculated pAHI (per hour):  3.3/hour        REM pAHI:    3.5/hour      NREM pAHI: 3.2/hour Supine AHI: 3.7/ hour  Oxygen Saturation Statistics:    Oxygen Saturation (%) Mean: 95%  Minimum oxygen saturation (%):    92%  O2 Saturation Range (%): 92-99%   O2 Saturation (minutes) <=88%:  0 min  Pulse Rate Statistics:   Pulse Mean (bpm):   64/min    Pulse Range (43-110/min)   IMPRESSION: This HST did not document any OSA (obstructive sleep apnea) to be present and there were no periods of clinically relevant hypoxia. Snoring was present, RDI was 6.5/h. Sleep architecture was normal for age and gender, with unfragmented sleep.   RECOMMENDATION: there is no need for apnea treatment/ intervention. 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      INTERPRETING PHYSICIAN:   INTERPRETING PHYSICIAN:  Melvyn Novas, MD    [facility] Board certified by The [employer] of [employer] Medicine and Diplomate of the [employer] Medicine. Board certified In Neurology through the ABPN, Fellow of the [employer]. Medical Director of [employer].

## 2021-01-17 ENCOUNTER — Encounter: Payer: 59 | Admitting: Physician Assistant

## 2021-01-18 ENCOUNTER — Encounter: Payer: 59 | Admitting: Adult Health Nurse Practitioner

## 2021-01-18 NOTE — Progress Notes (Signed)
Complete Physical  Assessment and Plan:   Graysin was seen today for annual exam.  Diagnoses and all orders for this visit:  Encounter for routine adult health examination without abnormal findings Due annually  Health Maintenance reviewed Healthy lifestyle reviewed and goals set  Primary hypertension Recently doing fairly off of meds Monitor blood pressure at home; call if consistently over 130/80 Continue DASH diet.   Reminder to go to the ER if any CP, SOB, nausea, dizziness, severe HA, changes vision/speech, left arm numbness and tingling and jaw pain -     CBC with Differential/Platelet -     COMPLETE METABOLIC PANEL WITH GFR -     Magnesium -     Urinalysis, Routine w reflex microscopic  Gastroesophageal reflux disease, unspecified whether esophagitis present Continue PPI, having some ? Breakthrough, has upcoming GI appointment Discussed diet, avoiding triggers and other lifestyle changes -     COMPLETE METABOLIC PANEL WITH GFR -     Magnesium  Anxiety Managing with lifestyle, try to get on day shift to improve sleep Stress management techniques discussed, increase water, good sleep hygiene discussed, increase exercise, and increase veggies.   Vitamin D deficiency Continue to recommend supplementation for goal of 60-100 -     VITAMIN D 25 Hydroxy (Vit-D Deficiency, Fractures)  Mixed hyperlipidemia Working on lifestyle Initial LDL goal <130 Discussed low saturated fat in diet, increase soluble fiber Reduce weight and increase exercise Check lipid panel, plan close follow up if not improving.  -     Lipid panel -     TSH  Chronic midline low back pain without sciatica Improved with regular massage therapy Weight loss encouraged, good posture and body mechanics encouraged  Overweight Long discussion about weight loss, diet, and exercise Recommended diet heavy in fruits and veggies and low in animal meats, cheeses, and dairy products, appropriate calorie  intake Discussed appropriate weight for height  Follow up at next visit  Medication management -     CBC with Differential/Platelet -     COMPLETE METABOLIC PANEL WITH GFR -     Magnesium  Screening for thyroid disorder -     TSH  Screening for hematuria or proteinuria -     Urinalysis, Routine w reflex microscopic  Irregular bowel habits ? IBS, GERD; has upcoming appointment with GI Likely also some dysbiosis; could have SIBO Avoid abrupt/dramatic dietary changes, small slow changes Suggested food log Suggested reading "Fiber fueled" by Dr. Lowanda Foster Suggested prebiotic fiber supplement;  May benefit from short trial of probiotic; "GI stability - standard process" and "Align" suggested, 1-3 months, d/c if no benefit and can repeat PRN.   Orders Placed This Encounter  Procedures   CBC with Differential/Platelet   COMPLETE METABOLIC PANEL WITH GFR   Magnesium   Lipid panel   VITAMIN D 25 Hydroxy (Vit-D Deficiency, Fractures)   Urinalysis, Routine w reflex microscopic   TSH    Discussed med's effects and SE's. Screening labs and tests as requested with regular follow-up as recommended. Over 40 minutes of exam, counseling, chart review and critical decision making was performed  Future Appointments  Date Time Provider Department Center  01/21/2022  9:00 AM Judd Gaudier, NP [facility] None    HPI 30 y.o. male patient presents for a complete physical. He has Back pain; Mixed hyperlipidemia; Hypertension; Vitamin D deficiency; Anxiety; Gastroesophageal reflux disease; Sleep disorder, circadian, shift work type; Sleep related choking sensation; and Loud snoring on their problem list.  He is  divorced, no kids. Lives with room mate. Works in [employer]. No new sexual partners, declines STD testing.   He was reporting non-restorative sleep, loud snoring. Had normal sleep study by Dr. Vickey Huger 11/2020. 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 He didn't benefit  from trazodone. Has been trying melatonin, taking 20 mg with variable benefit, 3-5 hours only on work days. He is working to get back on day shift. HE has recurrent sleep paralysis since childhood, manages with lifestyle changes, sleeps on his side and now has rarely.    Has history of back pain- much improved, doing regular deep tissue massage with benefit.   He also has GERD (controlled with omeprazole) but he has had ongoing loose stools/diarrhea, previously was very firm and not moving well. He has noted some early satiety. He does recall a few weeks of foul taste in mouth but has resolved. Notes very gassy. Denies belching. He is established with [facility], has follow up planned next week.   BMI is Body mass index is 27.07 kg/m., he is working on diet and exercise. Reports was down 10 lb and has regained some, struggles with night schedule. Has good routine when on day schedule. No soda. Rare caffine. No alcohol recently. Pushing water. Tries to eat clean, balanced diet and trying to avoid processed.  Wt Readings from Last 3 Encounters:  01/19/21 199 lb 9.6 oz (90.5 kg)  11/22/20 204 lb (92.5 kg)  10/02/20 201 lb (91.2 kg)   His blood pressure has been controlled at home, he is off of BP med, today their BP is BP: 128/72 He does workout, walking and weight lifting. He denies chest pain, shortness of breath, dizziness.   He is not on cholesterol medication, he has been working on diet and exercise.Marland Kitchen No family history of heart disease and smoke. His cholesterol is not at goal. Strong family hx of elevated cholesterol. Has reduced red meat and cut out a lot of daily.  The cholesterol last visit was:   Lab Results  Component Value Date   CHOL 232 (H) 01/19/2020   HDL 48 01/19/2020   LDLCALC 158 (H) 01/19/2020   TRIG 133 01/19/2020   CHOLHDL 4.8 01/19/2020   Last GFR: Lab Results  Component Value Date   GFRNONAA 95 01/19/2020    Patient is on Vitamin D supplement, unsure of dose.  Lab  Results  Component Value Date   VD25OH 32 01/19/2020      Current Medications:  Current Outpatient Medications on File Prior to Visit  Medication Sig Dispense Refill   ELDERBERRY PO Take by mouth daily.     famotidine (PEPCID) 20 MG tablet Take 1 tab 30 min prior to breakfast and bedtime as needed for reflux. 60 tablet 2   Wheat Dextrin (BENEFIBER PO) Take by mouth daily.     No current facility-administered medications on file prior to visit.   Allergies:  No Known Allergies   Health Maintenance:  Immunization History  Administered Date(s) Administered   Tdap 12/30/2016   Tetanus: 2018 Influenza: typically doesn't get  HPV: check with insurance -  COVID discussed  Declines STD screening Sleep study: 11/2020, negative  Eye Exam: normal vision, planning to schedule with new insurance Dentist: Dr. Kristian Covey, last visit 2020, goes q33m GI: [facility], unsure of provider   Patient Care Team: Lucky Cowboy, MD as PCP - General (Internal Medicine)  Medical History:  has Back pain; Mixed hyperlipidemia; Hypertension; Vitamin D deficiency; Anxiety; Gastroesophageal reflux  disease; Sleep disorder, circadian, shift work type; Sleep related choking sensation; and Loud snoring on their problem list. Surgical History:  He  has a past surgical history that includes No past surgeries. Family History:  His family history includes Asthma in his brother and mother; Bipolar disorder in his sister; Cancer in his paternal grandfather; Drug abuse in his maternal grandfather; Heart murmur in his sister; Hypertension in his sister; Melanoma (age of onset: 28) in his maternal aunt; Pancreatic cancer (age of onset: 32) in his paternal grandmother; Scoliosis in his sister. Social History:   reports that he has never smoked. He has never used smokeless tobacco. He reports current alcohol use. He reports that he does not use drugs.   Review of Systems:  Review of Systems  Constitutional:   Negative for chills, diaphoresis, fever, malaise/fatigue and weight loss.  HENT: Negative.    Eyes: Negative.   Respiratory: Negative.  Negative for shortness of breath.   Cardiovascular: Negative.  Negative for chest pain, palpitations, claudication and leg swelling.  Gastrointestinal:  Positive for abdominal pain (bloating, early satiety, gassiness). Negative for blood in stool, constipation (improved), diarrhea (no diarrhea but loose x 3 weeks, irregular), heartburn, melena, nausea and vomiting.  Genitourinary: Negative.   Musculoskeletal:  Negative for back pain (improved), falls, joint pain, myalgias and neck pain.  Skin: Negative.  Negative for rash.  Neurological:  Negative for dizziness, tingling, tremors, sensory change, speech change, focal weakness, seizures, loss of consciousness, weakness and headaches.  Psychiatric/Behavioral:  Negative for depression, hallucinations, memory loss, substance abuse and suicidal ideas. The patient has insomnia. The patient is not nervous/anxious.    Physical Exam: Estimated body mass index is 27.07 kg/m as calculated from the following:   Height as of this encounter: 6' (1.829 m).   Weight as of this encounter: 199 lb 9.6 oz (90.5 kg). BP 128/72   Pulse 81   Temp 97.9 F (36.6 C)   Ht 6' (1.829 m)   Wt 199 lb 9.6 oz (90.5 kg)   SpO2 98%   BMI 27.07 kg/m  General Appearance: Well nourished, in no apparent distress.  Eyes: PERRLA, EOMs, conjunctiva no swelling or erythema Sinuses: No Frontal/maxillary tenderness  ENT/Mouth: Ext aud canals clear, normal light reflex with TMs without erythema, bulging. Good dentition. No erythema, swelling, or exudate on post pharynx. Tonsils not swollen or erythematous. Hearing normal.  Neck: Supple, thyroid normal. No bruits  Respiratory: Respiratory effort normal, BS equal bilaterally without rales, rhonchi, wheezing or stridor.  Cardio: RRR without murmurs, rubs or gallops. Brisk peripheral pulses without  edema.  Chest: symmetric, with normal excursions and percussion.  Abdomen: Soft, nontender, no guarding, rebound, hernias, masses, or organomegaly.  Lymphatics: Non tender without lymphadenopathy.  Genitourinary: defer, denies concerns, doing regular checks Musculoskeletal: Full ROM all peripheral extremities,5/5 strength, and normal gait.  Skin: Warm, dry without rashes, lesions, ecchymosis. Neuro: Cranial nerves intact, reflexes equal bilaterally. Normal muscle tone, no cerebellar symptoms. Sensation intact.  Psych: Awake and oriented X 3, normal affect, Insight and Judgment appropriate.   EKG: WNL baseline in system, defer  Dan Maker, NP 3:21 PM [facility]

## 2021-01-19 ENCOUNTER — Ambulatory Visit (INDEPENDENT_AMBULATORY_CARE_PROVIDER_SITE_OTHER): Payer: 59 | Admitting: Adult Health

## 2021-01-19 ENCOUNTER — Other Ambulatory Visit: Payer: Self-pay

## 2021-01-19 ENCOUNTER — Encounter: Payer: Self-pay | Admitting: Adult Health

## 2021-01-19 VITALS — BP 128/72 | HR 81 | Temp 97.9°F | Ht 72.0 in | Wt 199.6 lb

## 2021-01-19 DIAGNOSIS — F419 Anxiety disorder, unspecified: Secondary | ICD-10-CM

## 2021-01-19 DIAGNOSIS — E559 Vitamin D deficiency, unspecified: Secondary | ICD-10-CM

## 2021-01-19 DIAGNOSIS — E782 Mixed hyperlipidemia: Secondary | ICD-10-CM

## 2021-01-19 DIAGNOSIS — Z1329 Encounter for screening for other suspected endocrine disorder: Secondary | ICD-10-CM

## 2021-01-19 DIAGNOSIS — Z79899 Other long term (current) drug therapy: Secondary | ICD-10-CM

## 2021-01-19 DIAGNOSIS — Z0000 Encounter for general adult medical examination without abnormal findings: Secondary | ICD-10-CM | POA: Diagnosis not present

## 2021-01-19 DIAGNOSIS — I10 Essential (primary) hypertension: Secondary | ICD-10-CM

## 2021-01-19 DIAGNOSIS — G8929 Other chronic pain: Secondary | ICD-10-CM

## 2021-01-19 DIAGNOSIS — K219 Gastro-esophageal reflux disease without esophagitis: Secondary | ICD-10-CM

## 2021-01-19 DIAGNOSIS — E663 Overweight: Secondary | ICD-10-CM

## 2021-01-19 DIAGNOSIS — Z1389 Encounter for screening for other disorder: Secondary | ICD-10-CM

## 2021-01-19 DIAGNOSIS — R198 Other specified symptoms and signs involving the digestive system and abdomen: Secondary | ICD-10-CM

## 2021-01-19 NOTE — Patient Instructions (Addendum)
George Clements , Thank you for taking time to come for your Annual Wellness Visit. I appreciate your ongoing commitment to your health goals. Please review the following plan we discussed and let me know if I can assist you in the future.   This is a list of the screening recommended for you and due dates:  Health Maintenance  Topic Date Due   COVID-19 Vaccine (1) Never done   Pneumococcal Vaccination (1 - PCV) 01/20/2031*   Flu Shot  01/22/2021   Tetanus Vaccine  12/31/2026   HPV Vaccine  Aged Out   Hepatitis C Screening: USPSTF Recommendation to screen - Ages 18-79 yo.  Discontinued   HIV Screening  Discontinued  *Topic was postponed. The date shown is not the original due date.     May benefit from short trial of probiotic; "GI stability - standard process" and "Align" suggested, 1-3 months, d/c if no benefit and can repeat PRN.   Start keeping a food log/stomach    Suggested reading "Fiber fueled" by Dr. Lowanda Foster  May benefit from short trial of probiotic; "GI stability - standard process" and "Align" suggested, 1-3 months, d/c if no benefit and can repeat PRN.      HPV (Human Papillomavirus) Vaccine: What You Need to Know 1. Why get vaccinated? HPV (human papillomavirus) vaccine can prevent infection with some types of human papillomavirus. HPV infections can cause certain types of cancers, including: cervical, vaginal, and vulvar cancers in women penile cancer in men anal cancers in both men and women cancers of tonsils, base of tongue, and back of throat (oropharyngeal cancer) in both men and women HPV infections can also cause anogenital warts. HPV vaccine can prevent over 90% of cancers caused by HPV. HPV is spread through intimate skin-to-skin or sexual contact. HPV infections are so common that nearly all people will get at least one type of HPV at some time in their lives. Most HPV infections go away on their own within 2 years. But sometimes HPV infections  will last longer and can cause cancers later inlife. 2. HPV vaccine HPV vaccine is routinely recommended for adolescents at 29 or 29 years of age to ensure they are protected before they are exposed to the virus. HPV vaccine may be given beginning at age 29 years and vaccination is recommended foreveryone through 29 years of age. HPV vaccine may be given to adults 29 through 29 years of age, based ondiscussions between the patient and health care provider. Most children who get the first dose before 16 years of age need 2 doses of HPV vaccine. People who get the first dose at or after 63 years of age and younger people with certain immunocompromising conditions need 3 doses. Your healthcare provider can give you more information. HPV vaccine may be given at the same time as other vaccines. 3. Talk with your health care provider Tell your vaccination provider if the person getting the vaccine: Has had an allergic reaction after a previous dose of HPV vaccine, or has any severe, life-threatening allergies Is pregnant--HPV vaccine is not recommended until after pregnancy In some cases, your health care provider may decide to postpone HPV vaccinationuntil a future visit. People with minor illnesses, such as a cold, may be vaccinated. People who are moderately or severely ill should usually wait until they recover beforegetting HPV vaccine. Your health care provider can give you more information. 4. Risks of a vaccine reaction Soreness, redness, or swelling where the shot is given can  happen after HPV vaccination. Fever or headache can happen after HPV vaccination. People sometimes faint after medical procedures, including vaccination. Tellyour provider if you feel dizzy or have vision changes or ringing in the ears. As with any medicine, there is a very remote chance of a vaccine causing asevere allergic reaction, other serious injury, or death. 5. What if there is a serious problem? An allergic  reaction could occur after the vaccinated person leaves the clinic. If you see signs of a severe allergic reaction (hives, swelling of the face and throat, difficulty breathing, a fast heartbeat, dizziness, or weakness), call 9-1-1 and get the person to the nearest hospital. For other signs that concern you, call your health care provider. Adverse reactions should be reported to the Vaccine Adverse Event Reporting System (VAERS). Your health care provider will usually file this report, or you can do it yourself. Visit the VAERS website at www.vaers.LAgents.no or call 343-538-4875. VAERS is only for reporting reactions, and VAERS staff members do not give medical advice. 6. The National Vaccine Injury Compensation Program The [employer] Vaccine Injury Compensation Program (VICP) is a federal program that was created to compensate people who may have been injured by certain vaccines. Claims regarding alleged injury or death due to vaccination have a time limit for filing, which may be as short as two years. Visit the VICP website at SpiritualWord.at or call 424-442-7512 to learn about the program and about filing a claim. 7. How can I learn more? Ask your health care provider. Call your local or state health department. Visit the website of the Food and Drug Administration (FDA) for vaccine package inserts and additional information at FinderList.no. Contact the Centers for Disease Control and Prevention (CDC): Call (414)287-0618 (1-800-CDC-INFO) or Visit CDC's website at PicCapture.uy. Vaccine Information Statement HPV Vaccine (01/28/2020) This information is not intended to replace advice given to you by your health care provider. Make sure you discuss any questions you have with your healthcare provider. Document Revised: 03/07/2020 Document Reviewed: 03/07/2020 Elsevier Patient Education  2022 [employer].

## 2021-01-20 LAB — CBC WITH DIFFERENTIAL/PLATELET
Absolute Monocytes: 713 cells/uL (ref 200–950)
Basophils Absolute: 38 cells/uL (ref 0–200)
Basophils Relative: 0.5 %
Eosinophils Absolute: 38 cells/uL (ref 15–500)
Eosinophils Relative: 0.5 %
HCT: 46.5 % (ref 38.5–50.0)
Hemoglobin: 15.1 g/dL (ref 13.2–17.1)
Lymphs Abs: 1778 cells/uL (ref 850–3900)
MCH: 30.2 pg (ref 27.0–33.0)
MCHC: 32.5 g/dL (ref 32.0–36.0)
MCV: 93 fL (ref 80.0–100.0)
MPV: 10.3 fL (ref 7.5–12.5)
Monocytes Relative: 9.5 %
Neutro Abs: 4935 cells/uL (ref 1500–7800)
Neutrophils Relative %: 65.8 %
Platelets: 264 10*3/uL (ref 140–400)
RBC: 5 10*6/uL (ref 4.20–5.80)
RDW: 12.9 % (ref 11.0–15.0)
Total Lymphocyte: 23.7 %
WBC: 7.5 10*3/uL (ref 3.8–10.8)

## 2021-01-20 LAB — URINALYSIS, ROUTINE W REFLEX MICROSCOPIC
Bilirubin Urine: NEGATIVE
Hgb urine dipstick: NEGATIVE
Ketones, ur: NEGATIVE
Leukocytes,Ua: NEGATIVE
Nitrite: NEGATIVE
Protein, ur: NEGATIVE
Specific Gravity, Urine: 1.023 (ref 1.001–1.035)
pH: 6 (ref 5.0–8.0)

## 2021-01-20 LAB — LIPID PANEL
Cholesterol: 208 mg/dL — ABNORMAL HIGH (ref <200)
HDL: 51 mg/dL (ref >=40)
LDL Cholesterol (Calc): 134 mg/dL (calc) — ABNORMAL HIGH
Non-HDL Cholesterol (Calc): 157 mg/dL (calc) — ABNORMAL HIGH (ref <130)
Total CHOL/HDL Ratio: 4.1 (calc) (ref <5.0)
Triglycerides: 119 mg/dL (ref <150)

## 2021-01-20 LAB — COMPLETE METABOLIC PANEL WITH GFR
AG Ratio: 1.7 (calc) (ref 1.0–2.5)
ALT: 13 U/L (ref 9–46)
AST: 14 U/L (ref 10–40)
Albumin: 4.5 g/dL (ref 3.6–5.1)
Alkaline phosphatase (APISO): 67 U/L (ref 36–130)
BUN: 16 mg/dL (ref 7–25)
CO2: 28 mmol/L (ref 20–32)
Calcium: 9.7 mg/dL (ref 8.6–10.3)
Chloride: 102 mmol/L (ref 98–110)
Creat: 0.96 mg/dL (ref 0.60–1.24)
Globulin: 2.7 g/dL (calc) (ref 1.9–3.7)
Glucose, Bld: 95 mg/dL (ref 65–99)
Potassium: 3.8 mmol/L (ref 3.5–5.3)
Sodium: 138 mmol/L (ref 135–146)
Total Bilirubin: 0.6 mg/dL (ref 0.2–1.2)
Total Protein: 7.2 g/dL (ref 6.1–8.1)
eGFR: 110 mL/min/{1.73_m2} (ref >=60)

## 2021-01-20 LAB — VITAMIN D 25 HYDROXY (VIT D DEFICIENCY, FRACTURES): Vit D, 25-Hydroxy: 34 ng/mL (ref 30–100)

## 2021-01-20 LAB — MAGNESIUM: Magnesium: 2.3 mg/dL (ref 1.5–2.5)

## 2021-01-20 LAB — TSH: TSH: 1.23 mIU/L (ref 0.40–4.50)

## 2021-01-22 ENCOUNTER — Other Ambulatory Visit: Payer: Self-pay | Admitting: Adult Health

## 2021-02-07 ENCOUNTER — Other Ambulatory Visit: Payer: Self-pay | Admitting: Physician Assistant

## 2021-02-07 DIAGNOSIS — R110 Nausea: Secondary | ICD-10-CM

## 2021-02-07 DIAGNOSIS — R1013 Epigastric pain: Secondary | ICD-10-CM

## 2021-02-13 ENCOUNTER — Ambulatory Visit
Admission: RE | Admit: 2021-02-13 | Discharge: 2021-02-13 | Disposition: A | Discharge status: Home / Self Care | Payer: 59 | Source: Ambulatory Visit | Attending: Physician Assistant | Admitting: Physician Assistant

## 2021-02-13 DIAGNOSIS — R1013 Epigastric pain: Secondary | ICD-10-CM

## 2021-02-13 DIAGNOSIS — R110 Nausea: Secondary | ICD-10-CM

## 2021-02-14 ENCOUNTER — Other Ambulatory Visit: Payer: Self-pay | Admitting: Adult Health

## 2021-02-14 DIAGNOSIS — K219 Gastro-esophageal reflux disease without esophagitis: Secondary | ICD-10-CM

## 2021-02-14 MED ORDER — OMEPRAZOLE 20 MG PO CPDR
DELAYED_RELEASE_CAPSULE | ORAL | 0 refills | Status: DC
Start: 2021-02-14 — End: 2021-07-03

## 2021-06-03 ENCOUNTER — Encounter: Payer: Self-pay | Admitting: Adult Health

## 2021-06-06 ENCOUNTER — Other Ambulatory Visit: Payer: Self-pay | Admitting: Adult Health

## 2021-06-06 MED ORDER — TRAZODONE HCL 150 MG PO TABS: ORAL_TABLET | ORAL | 0 refills | Status: AC

## 2021-07-02 ENCOUNTER — Encounter: Payer: Self-pay | Admitting: Adult Health

## 2021-07-03 ENCOUNTER — Other Ambulatory Visit: Payer: Self-pay | Admitting: Adult Health

## 2021-07-03 DIAGNOSIS — K219 Gastro-esophageal reflux disease without esophagitis: Secondary | ICD-10-CM

## 2021-07-03 MED ORDER — OMEPRAZOLE 20 MG PO CPDR: DELAYED_RELEASE_CAPSULE | ORAL | 1 refills | Status: AC

## 2021-09-11 ENCOUNTER — Encounter: Payer: Self-pay | Admitting: Adult Health

## 2022-01-21 ENCOUNTER — Encounter: Payer: Self-pay | Admitting: Nurse Practitioner

## 2022-01-21 ENCOUNTER — Ambulatory Visit (INDEPENDENT_AMBULATORY_CARE_PROVIDER_SITE_OTHER): Payer: 59 | Admitting: Nurse Practitioner

## 2022-01-21 VITALS — BP 132/78 | HR 95 | Temp 97.7°F | Ht 71.5 in | Wt 203.0 lb

## 2022-01-21 DIAGNOSIS — Z0000 Encounter for general adult medical examination without abnormal findings: Secondary | ICD-10-CM

## 2022-01-21 DIAGNOSIS — F419 Anxiety disorder, unspecified: Secondary | ICD-10-CM

## 2022-01-21 DIAGNOSIS — Z1283 Encounter for screening for malignant neoplasm of skin: Secondary | ICD-10-CM

## 2022-01-21 DIAGNOSIS — I10 Essential (primary) hypertension: Secondary | ICD-10-CM | POA: Diagnosis not present

## 2022-01-21 DIAGNOSIS — R198 Other specified symptoms and signs involving the digestive system and abdomen: Secondary | ICD-10-CM

## 2022-01-21 DIAGNOSIS — Z79899 Other long term (current) drug therapy: Secondary | ICD-10-CM

## 2022-01-21 DIAGNOSIS — Z1329 Encounter for screening for other suspected endocrine disorder: Secondary | ICD-10-CM

## 2022-01-21 DIAGNOSIS — E782 Mixed hyperlipidemia: Secondary | ICD-10-CM

## 2022-01-21 DIAGNOSIS — Z0001 Encounter for general adult medical examination with abnormal findings: Secondary | ICD-10-CM

## 2022-01-21 DIAGNOSIS — Z1389 Encounter for screening for other disorder: Secondary | ICD-10-CM

## 2022-01-21 DIAGNOSIS — E663 Overweight: Secondary | ICD-10-CM

## 2022-01-21 DIAGNOSIS — M545 Low back pain, unspecified: Secondary | ICD-10-CM

## 2022-01-21 DIAGNOSIS — E559 Vitamin D deficiency, unspecified: Secondary | ICD-10-CM

## 2022-01-21 DIAGNOSIS — K219 Gastro-esophageal reflux disease without esophagitis: Secondary | ICD-10-CM

## 2022-01-21 DIAGNOSIS — Z136 Encounter for screening for cardiovascular disorders: Secondary | ICD-10-CM

## 2022-01-21 NOTE — Patient Instructions (Signed)

## 2022-01-21 NOTE — Progress Notes (Signed)
Complete Physical  Assessment and Plan:  George Clements was seen today for annual exam.  Diagnoses and all orders for this visit:  Encounter for routine adult health examination without abnormal findings Due annually  Health Maintenance reviewed Healthy lifestyle reviewed and goals set  Primary hypertension Controlled without medications. Discussed DASH (Dietary Approaches to Stop Hypertension) DASH diet is lower in sodium than a typical American diet. Cut back on foods that are high in saturated fat, cholesterol, and trans fats. Eat more whole-grain foods, fish, poultry, and nuts Remain active and exercise as tolerated daily.  Monitor BP at home-Call if greater than 130/80.  Check CBC/CMP  Gastroesophageal reflux disease, unspecified whether esophagitis present No suspected reflux complications (Barret/stricture). Lifestyle modification:  wt loss, avoid meals 2-3h before bedtime. Consider eliminating food triggers:  chocolate, caffeine, EtOH, acid/spicy food.    Anxiety Now on day-shift, much improved. Managing with lifestyle Stress management techniques discussed, increase water, good sleep hygiene discussed, increase exercise, and increase veggies.   Vitamin D deficiency Continue to recommend supplementation for goal of 60-100 Monitor Vitamin D levels  Mixed hyperlipidemia Slightly elevated Discussed lifestyle modifications. Recommended diet heavy in fruits and veggies, omega 3's. Decrease consumption of animal meats, cheeses, and dairy products. Remain active and exercise as tolerated. Continue to monitor.  Chronic midline low back pain without sciatica Improved with regular massage therapy Weight loss encouraged, good posture and body mechanics encouraged Once a month has a massage therapist for deep tissue  Overweight Long discussion about weight loss, diet, and exercise Recommended diet heavy in fruits and veggies and low in animal meats, cheeses, and dairy  products, appropriate calorie intake Discussed appropriate weight for height  Follow up at next visit  Medication management All medications discussed and reviewed in full. All questions and concerns regarding medications addressed.     Screening for thyroid disorder -     TSH  Screening for hematuria or proteinuria -     Urinalysis, Routine w reflex microscopic No STD screening.   Irregular bowel habits Resolved. Avoid abrupt/dramatic dietary changes, small slow changes Continue to monitor  Screening for skin cancer Referral to dermatology  Orders Placed This Encounter  Procedures   CBC with Differential/Platelet   COMPLETE METABOLIC PANEL WITH GFR   Lipid panel   TSH   VITAMIN D 25 Hydroxy (Vit-D Deficiency, Fractures)   Urinalysis, Routine w reflex microscopic   Ambulatory referral to Dermatology    Referral Priority:   Routine    Referral Type:   Consultation    Referral Reason:   Specialty Services Required    Requested Specialty:   Dermatology    Number of Visits Requested:   1   EKG 12-Lead     Discussed med's effects and SE's. Screening labs and tests as requested with regular follow-up as recommended.  Over 30 minutes of exam, counseling, chart review and critical decision making was performed  Future Appointments  Date Time Provider Department Center  01/22/2023 10:00 AM Adela Glimpse, NP [facility] None    HPI 31 y.o. male patient presents for a complete physical. He has Back pain; Mixed hyperlipidemia; Hypertension; Vitamin D deficiency; Anxiety; Gastroesophageal reflux disease; Sleep disorder, circadian, shift work type; Sleep related choking sensation; Loud snoring; and Overweight (BMI 25.0-29.9) on their problem list.  He is divorced, no kids. Lives with room mate. Works in [employer]. No new sexual partners, declines STD testing.   He has recently switched from night shift to day shift approximately  44 months ago.  He reports  feeling much better overall with a decrease in GERD symptoms, anxiety and insomnia.   He was reporting non-restorative sleep, loud snoring. Had normal sleep study by Dr. Vickey Huger 11/2020. 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    Has history of back pain- much improved, doing regular deep tissue massage with benefit.   He also has GERD (controlled with omeprazole) but he has had ongoing loose stools/diarrhea, previously was very firm and not moving well. He has noted some early satiety. He does recall a few weeks of foul taste in mouth but has resolved. Notes very gassy. Denies belching. He is established with [facility], has follow up planned next week. Much improved since starting day shift.  BMI is Body mass index is 27.92 kg/m., he is working on diet and exercise. Reports was down 10 lb and has regained some, but now active in hiking. Has good routine when on day schedule. No soda. Rare caffine. No alcohol recently. Pushing water. Tries to eat clean, balanced diet and trying to avoid processed.  Wt Readings from Last 3 Encounters:  01/21/22 203 lb (92.1 kg)  01/19/21 199 lb 9.6 oz (90.5 kg)  11/22/20 204 lb (92.5 kg)   His blood pressure has been controlled at home, he is off of BP med, today their BP is BP: 132/78 He does workout, walking and weight lifting. He denies chest pain, shortness of breath, dizziness.   He is not on cholesterol medication, he has been working on diet and exercise.Marland Kitchen No family history of heart disease and smoke. His cholesterol is not at goal. Strong family hx of elevated cholesterol. Has reduced red meat and cut out a lot of daily.  The cholesterol last visit was:   Lab Results  Component Value Date   CHOL 208 (H) 01/19/2021   HDL 51 01/19/2021   LDLCALC 134 (H) 01/19/2021   TRIG 119 01/19/2021   CHOLHDL 4.1 01/19/2021    Last GFR: Lab Results  Component Value Date   GFRNONAA 95 01/19/2020    Patient is on Vitamin D supplement, unsure of dose.  Lab Results  Component Value Date   VD25OH 34 01/19/2021      Current Medications:  Current Outpatient Medications on File Prior to Visit  Medication Sig Dispense Refill   ELDERBERRY PO Take by mouth daily. (Patient not taking: Reported on 01/21/2022)     famotidine (PEPCID) 20 MG tablet Take 1 tab 30 min prior to breakfast and bedtime as needed for reflux. (Patient not taking: Reported on 01/21/2022) 60 tablet 2   omeprazole (PRILOSEC) 20 MG capsule Take 1 cap 60 min prior to meal daily for acid reflux. (Patient not taking: Reported on 01/21/2022) 90 capsule 1   traZODone (DESYREL) 150 MG tablet Take 1/2-1 tab 1 hour prior to bedtime as needed for sleep. (Patient not taking: Reported on 01/21/2022) 30 tablet 0   Wheat Dextrin (BENEFIBER PO) Take by mouth daily. (Patient not taking: Reported on 01/21/2022)     No current facility-administered medications on file prior to visit.   Allergies:  No Known Allergies   Health Maintenance:  Immunization History  Administered Date(s) Administered   Tdap 12/30/2016   Tetanus: 2018 Influenza: typically doesn't get  HPV: check with insurance -  COVID discussed  Declines STD screening Sleep study: 11/2020, negative  Eye Exam:  Will reach out to employer for coverage with insurance.  Dentist: Dr. Kristian Covey, last visit 2020, goes q43m GI: [facility]  Dermatology:  referral full body skin check.    Patient Care Team: Lucky Cowboy, MD as PCP - General (Internal Medicine)  Medical History:  has Back pain; Mixed hyperlipidemia; Hypertension; Vitamin D deficiency; Anxiety; Gastroesophageal reflux disease; Sleep disorder, circadian, shift work type; Sleep related choking sensation; Loud snoring; and Overweight (BMI 25.0-29.9) on their problem list. Surgical History:  He  has a past surgical history that includes No  past surgeries. Family History:  His family history includes Asthma in his brother and mother; Bipolar disorder in his sister; Cancer in his paternal grandfather; Drug abuse in his maternal grandfather; Heart murmur in his sister; Hypertension in his sister; Melanoma (age of onset: 65) in his maternal aunt; Pancreatic cancer (age of onset: 107) in his paternal grandmother; Scoliosis in his sister. Social History:   reports that he has never smoked. He has never used smokeless tobacco. He reports current alcohol use. He reports that he does not use drugs.   Review of Systems:  Review of Systems  Constitutional:  Negative for chills, diaphoresis, fever, malaise/fatigue and weight loss.  HENT: Negative.    Eyes: Negative.   Respiratory: Negative.  Negative for shortness of breath.   Cardiovascular: Negative.  Negative for chest pain, palpitations, claudication and leg swelling.  Gastrointestinal:  Negative for abdominal pain, blood in stool, constipation (improved), diarrhea, heartburn, melena, nausea and vomiting.  Genitourinary: Negative.   Musculoskeletal:  Negative for back pain (improved), falls, joint pain, myalgias and neck pain.  Skin: Negative.  Negative for rash.  Neurological:  Negative for dizziness, tingling, tremors, sensory change, speech change, focal weakness, seizures, loss of consciousness, weakness and headaches.  Psychiatric/Behavioral:  Negative for depression, hallucinations, memory loss, substance abuse and suicidal ideas. The patient is not nervous/anxious and does not have insomnia.     Physical Exam: Estimated body mass index is 27.92 kg/m as calculated from the following:   Height as of this encounter: 5' 11.5" (1.816 m).   Weight as of this encounter: 203 lb (92.1 kg). BP 132/78   Pulse 95   Temp 97.7 F (36.5 C)   Ht 5' 11.5" (1.816 m)   Wt 203 lb (92.1 kg)   SpO2 96%   BMI 27.92 kg/m  General Appearance: Well nourished, in no apparent distress.  Eyes:  PERRLA, EOMs, conjunctiva no swelling or erythema Sinuses: No Frontal/maxillary tenderness  ENT/Mouth: Ext aud canals clear, normal light reflex with TMs without erythema, bulging. Good dentition. No erythema, swelling, or exudate on post pharynx. Tonsils not swollen or erythematous. Hearing normal.  Neck: Supple, thyroid normal. No bruits  Respiratory: Respiratory effort normal, BS equal bilaterally without rales, rhonchi, wheezing or stridor.  Cardio: RRR without murmurs, rubs or gallops. Brisk peripheral pulses without edema.  Chest: symmetric, with normal excursions and percussion.  Abdomen: Soft, nontender, no guarding, rebound, hernias, masses, or organomegaly.  Lymphatics: Non tender without lymphadenopathy.  Genitourinary: defer, denies concerns, doing regular checks Musculoskeletal: Full ROM all peripheral extremities,5/5 strength, and normal gait.  Skin: Warm, dry without rashes, lesions, ecchymosis. Neuro: Cranial nerves intact, reflexes equal bilaterally. Normal muscle tone, no cerebellar symptoms. Sensation intact.  Psych: Awake and oriented X 3, normal affect, Insight and Judgment appropriate.   EKG: WNL NSR  Adela Glimpse, NP 10:32 AM [facility]

## 2022-01-22 ENCOUNTER — Encounter: Payer: Self-pay | Admitting: Nurse Practitioner

## 2022-01-22 LAB — CBC WITH DIFFERENTIAL/PLATELET
Absolute Monocytes: 716 cells/uL (ref 200–950)
Basophils Absolute: 31 cells/uL (ref 0–200)
Basophils Relative: 0.4 %
Eosinophils Absolute: 39 cells/uL (ref 15–500)
Eosinophils Relative: 0.5 %
HCT: 46 % (ref 38.5–50.0)
Hemoglobin: 15.7 g/dL (ref 13.2–17.1)
Lymphs Abs: 1286 cells/uL (ref 850–3900)
MCH: 30.7 pg (ref 27.0–33.0)
MCHC: 34.1 g/dL (ref 32.0–36.0)
MCV: 90 fL (ref 80.0–100.0)
MPV: 10.5 fL (ref 7.5–12.5)
Monocytes Relative: 9.3 %
Neutro Abs: 5629 cells/uL (ref 1500–7800)
Neutrophils Relative %: 73.1 %
Platelets: 261 10*3/uL (ref 140–400)
RBC: 5.11 10*6/uL (ref 4.20–5.80)
RDW: 12 % (ref 11.0–15.0)
Total Lymphocyte: 16.7 %
WBC: 7.7 10*3/uL (ref 3.8–10.8)

## 2022-01-22 LAB — LIPID PANEL
Cholesterol: 192 mg/dL (ref <200)
HDL: 46 mg/dL (ref >=40)
LDL Cholesterol (Calc): 120 mg/dL (calc) — ABNORMAL HIGH
Non-HDL Cholesterol (Calc): 146 mg/dL (calc) — ABNORMAL HIGH (ref <130)
Total CHOL/HDL Ratio: 4.2 (calc) (ref <5.0)
Triglycerides: 149 mg/dL (ref <150)

## 2022-01-22 LAB — COMPLETE METABOLIC PANEL WITH GFR
AG Ratio: 1.6 (calc) (ref 1.0–2.5)
ALT: 15 U/L (ref 9–46)
AST: 14 U/L (ref 10–40)
Albumin: 4.6 g/dL (ref 3.6–5.1)
Alkaline phosphatase (APISO): 70 U/L (ref 36–130)
BUN: 14 mg/dL (ref 7–25)
CO2: 27 mmol/L (ref 20–32)
Calcium: 9.7 mg/dL (ref 8.6–10.3)
Chloride: 104 mmol/L (ref 98–110)
Creat: 1.09 mg/dL (ref 0.60–1.26)
Globulin: 2.8 g/dL (calc) (ref 1.9–3.7)
Glucose, Bld: 83 mg/dL (ref 65–99)
Potassium: 4.3 mmol/L (ref 3.5–5.3)
Sodium: 140 mmol/L (ref 135–146)
Total Bilirubin: 0.6 mg/dL (ref 0.2–1.2)
Total Protein: 7.4 g/dL (ref 6.1–8.1)
eGFR: 94 mL/min/{1.73_m2} (ref >=60)

## 2022-01-22 LAB — URINALYSIS, ROUTINE W REFLEX MICROSCOPIC
Bilirubin Urine: NEGATIVE
Glucose, UA: NEGATIVE
Hgb urine dipstick: NEGATIVE
Ketones, ur: NEGATIVE
Leukocytes,Ua: NEGATIVE
Nitrite: NEGATIVE
Protein, ur: NEGATIVE
Specific Gravity, Urine: 1.015 (ref 1.001–1.035)
pH: 8 (ref 5.0–8.0)

## 2022-01-22 LAB — VITAMIN D 25 HYDROXY (VIT D DEFICIENCY, FRACTURES): Vit D, 25-Hydroxy: 33 ng/mL (ref 30–100)

## 2022-01-22 LAB — TSH: TSH: 1.05 mIU/L (ref 0.40–4.50)

## 2022-05-24 IMAGING — RF DG UGI W/ HIGH DENSITY W/O KUB
9 of 10 series · 14 of 24 positions shown · non-contrast
Comparison: 07/19/2020 abdominal radiograph.

CLINICAL DATA: Worsening nausea, early satiety and dyspepsia for 6
months.

EXAM:
UPPER GI SERIES WITH KUB
TECHNIQUE: After obtaining a scout radiograph a routine upper GI series was
performed using thin and high density barium.
FLUOROSCOPY TIME:  Fluoroscopy Time:  3 minutes 48 seconds
Radiation Exposure Index (if provided by the fluoroscopic device):
43 mGy
Number of Acquired Spot Images: 11

[Series 1: one shot · 0.14mm/px · 5 of 13 slices shown (1 of 4)]
[im 1/13]
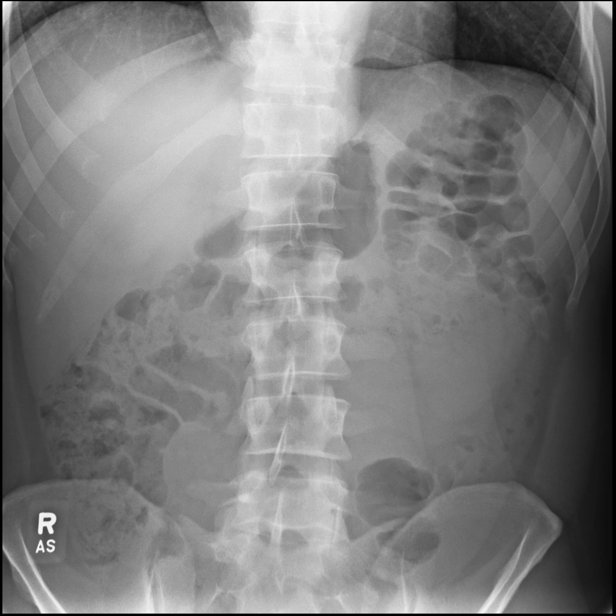
[im 4/13]
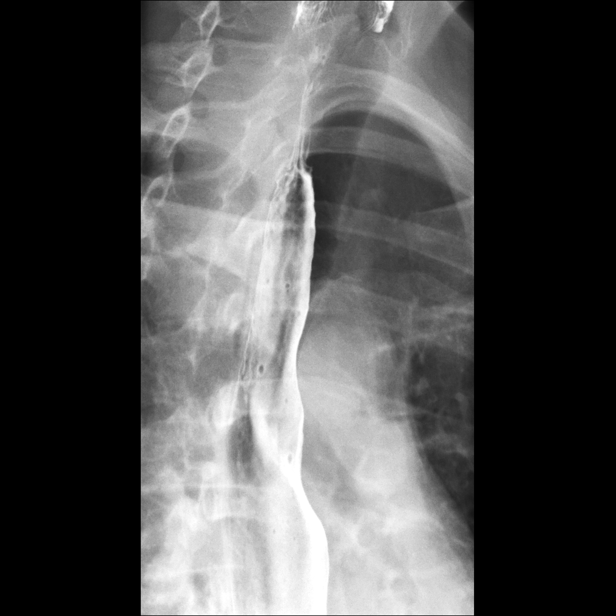
[im 7/13]
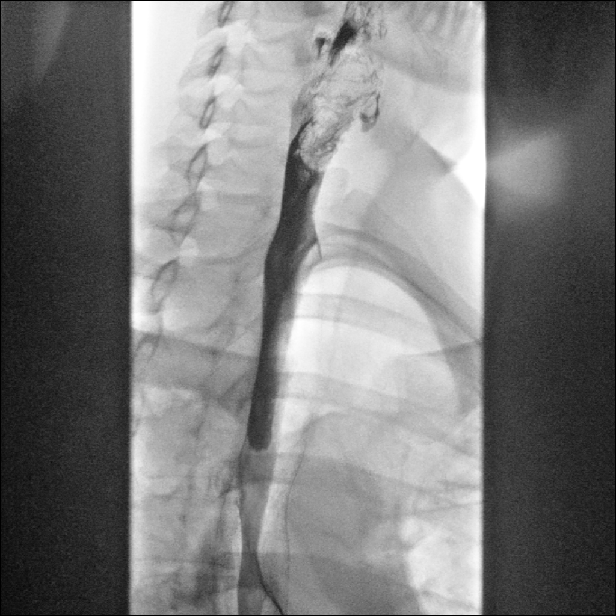
[im 10/13]
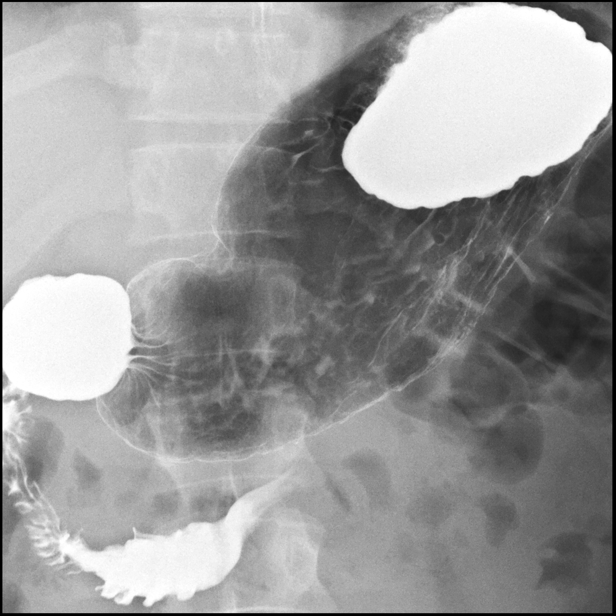
[im 12/13]
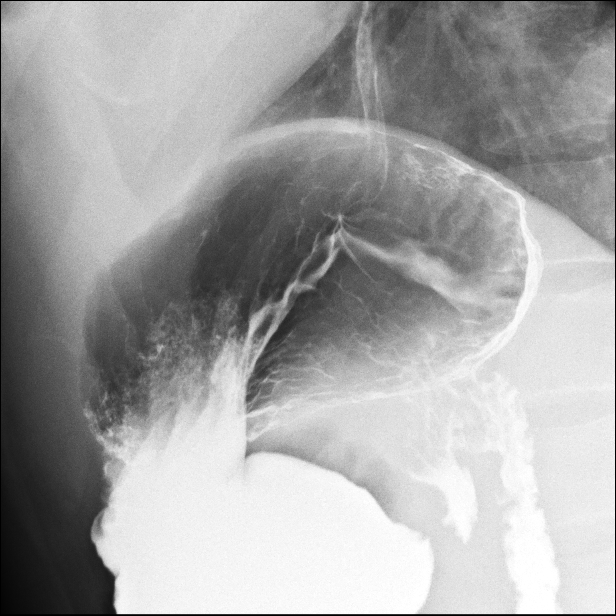

[Series 2: sequence · 1 of 37 frames shown (1 of 5)]
[frame 6/37]
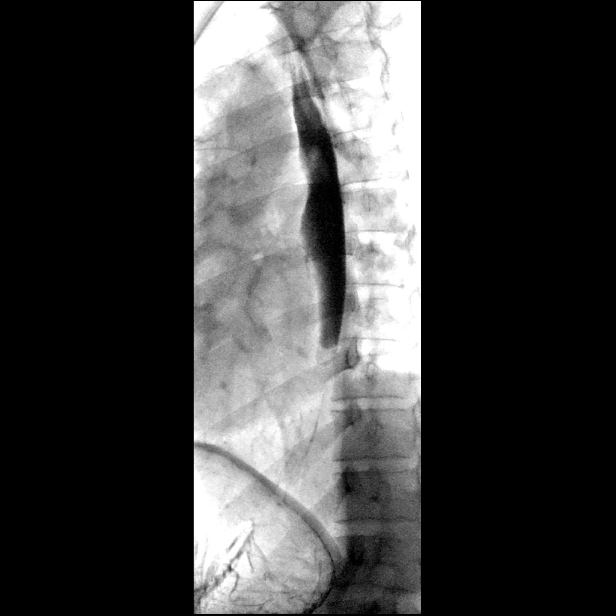

[Series 3: one shot · 1 of 1 slices shown (2 of 4)]
[im 1/1]
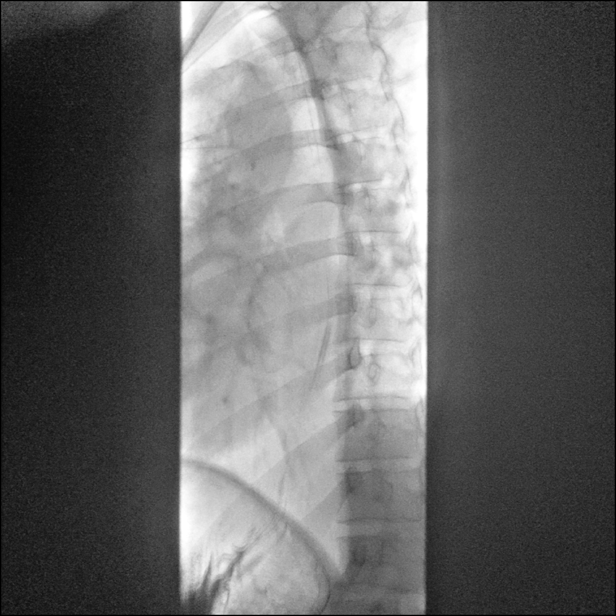

[Series 4: sequence · 1 of 65 frames shown (2 of 5)]
[frame 33/65]
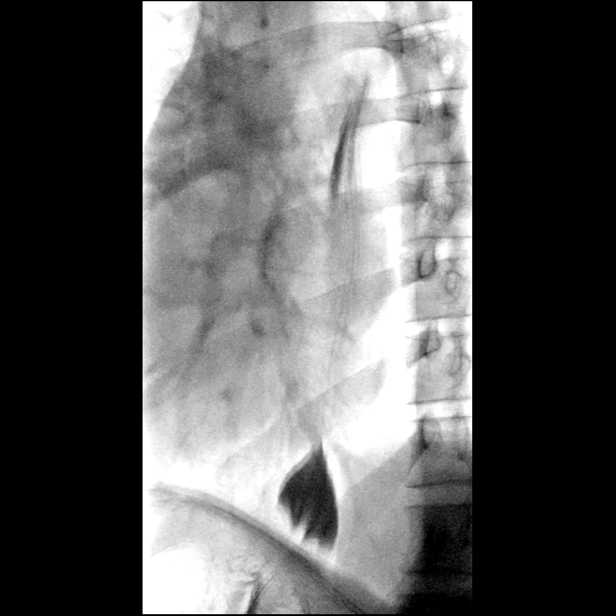

[Series 5: one shot · 0.16mm/px · 1 of 1 slices shown (3 of 4)]
[im 1/1]
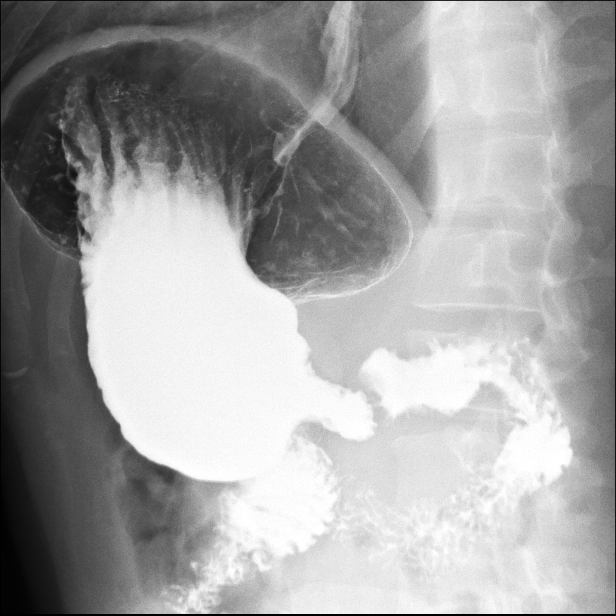

[Series 6: sequence · 1 of 20 frames shown (3 of 5)]
[frame 11/20]
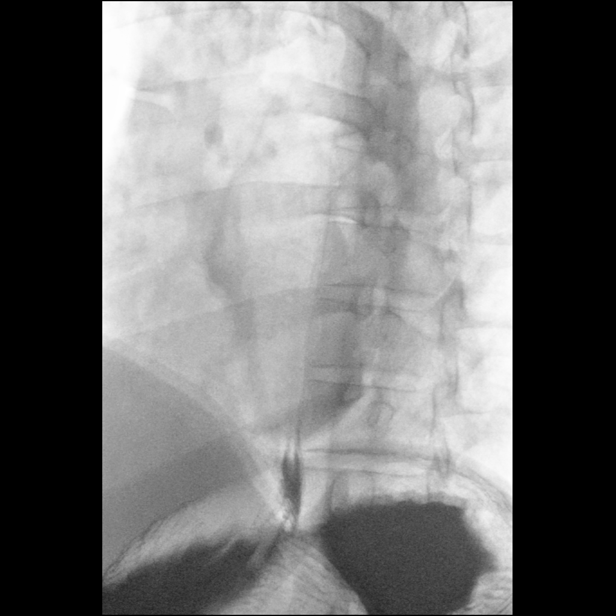

[Series 8: sequence · 2 of 71 frames shown (4 of 5)]
[frame 11/71]
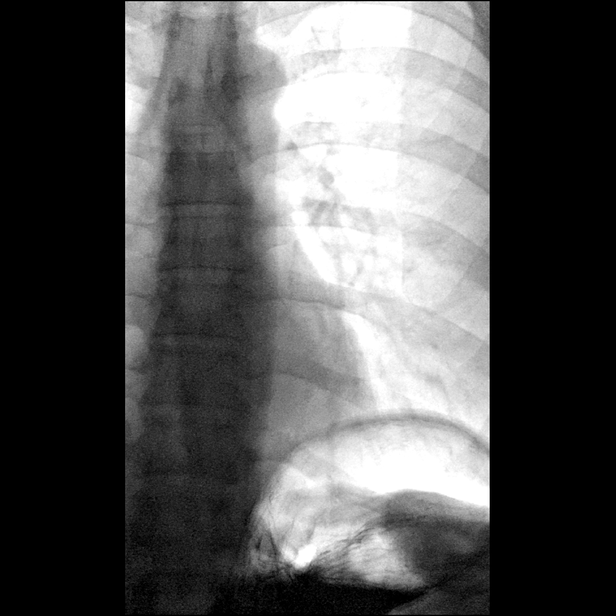
[frame 36/71]
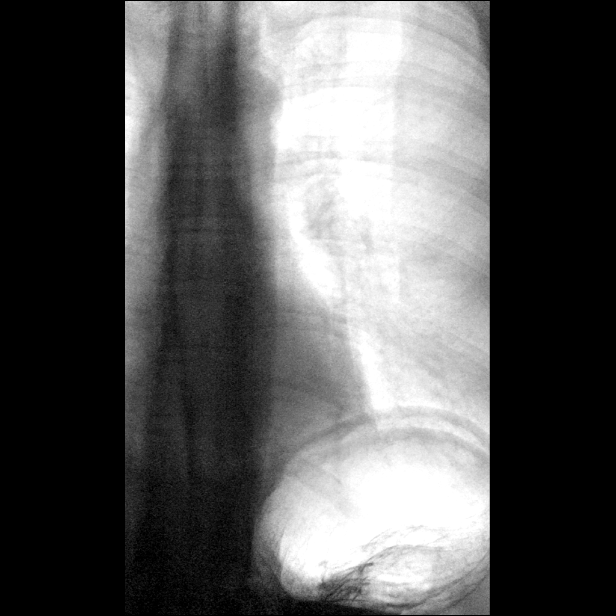

[Series 9: sequence · 1 of 85 frames shown (5 of 5)]
[frame 13/85]
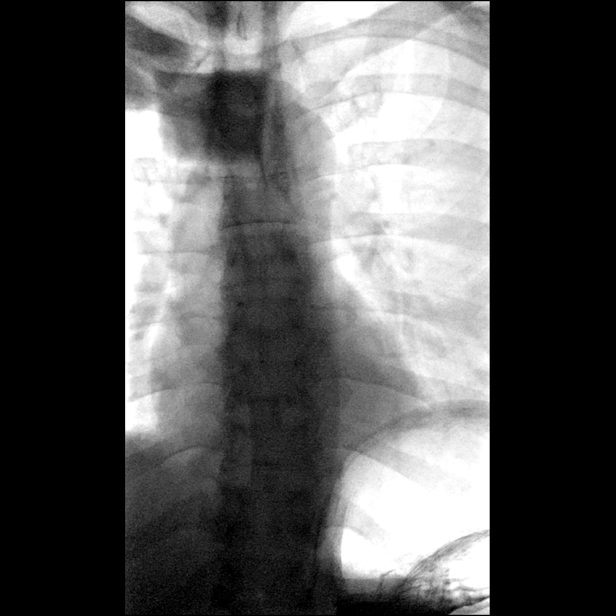

[Series 10: one shot · 1 of 1 slices shown (4 of 4)]
[im 1/1]
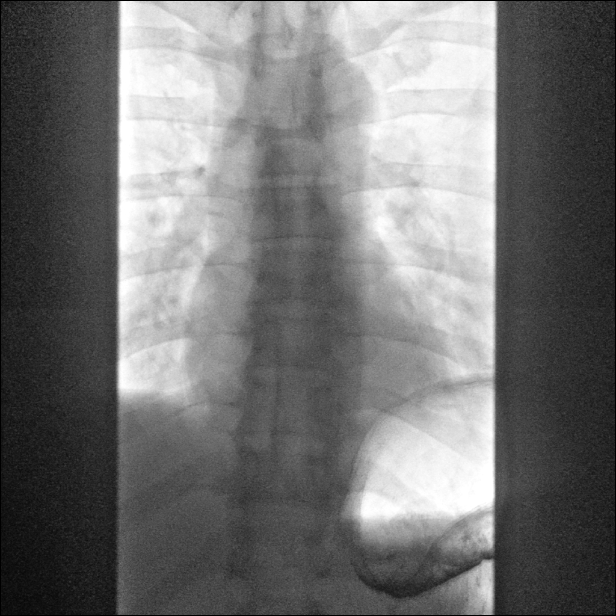

[14 of 24 positions shown; findings below may reference images not displayed]

FINDINGS: Scout radiograph demonstrates no dilated small bowel loops.
Mild-to-moderate colonic stool. No evidence of pneumatosis or
pneumoperitoneum. No radiopaque nephrolithiasis. Clear lung bases.

No hiatal hernia. Mild gastroesophageal reflux elicited to the level
of the lower thoracic esophagus with water siphon test. Mild
granularity of the mid to lower thoracic esophageal mucosa,
suggesting mild reflux esophagitis. Normal esophageal
distensibility, with no evidence of esophageal mass, ulcer or
stricture. Normal esophageal motility. Swallowed 13 mm barium tablet
traversed the esophagus into the stomach without delay.

Normal gastric emptying. No gastric fold thickening, filling
defects, ulcers or significant erosions. Normal duodenal bulb and C
sweep, with no duodenal fold thickening, filling defects, strictures
or ulcers. Normal duodenal jejunal junction to the left of the
spine. Visualized proximal jejunal loops are normal caliber and
without fold thickening.
IMPRESSION: 1. Mild gastroesophageal reflux elicited.  No hiatal hernia.
2. Suggestion of mild reflux esophagitis. No evidence of esophageal
mass or stricture.
3. Otherwise normal upper GI.  No evidence of peptic ulcer disease.

## 2022-10-11 ENCOUNTER — Encounter: Payer: Self-pay | Admitting: Nurse Practitioner

## 2023-01-22 ENCOUNTER — Encounter: Payer: Self-pay | Admitting: Nurse Practitioner

## 2023-01-22 ENCOUNTER — Ambulatory Visit (INDEPENDENT_AMBULATORY_CARE_PROVIDER_SITE_OTHER): Payer: 59 | Admitting: Nurse Practitioner

## 2023-01-22 VITALS — BP 122/78 | HR 76 | Temp 97.8°F | Ht 71.5 in | Wt 211.4 lb

## 2023-01-22 DIAGNOSIS — Z0000 Encounter for general adult medical examination without abnormal findings: Secondary | ICD-10-CM | POA: Diagnosis not present

## 2023-01-22 DIAGNOSIS — Z789 Other specified health status: Secondary | ICD-10-CM

## 2023-01-22 DIAGNOSIS — I10 Essential (primary) hypertension: Secondary | ICD-10-CM

## 2023-01-22 DIAGNOSIS — Z1389 Encounter for screening for other disorder: Secondary | ICD-10-CM

## 2023-01-22 DIAGNOSIS — E663 Overweight: Secondary | ICD-10-CM

## 2023-01-22 DIAGNOSIS — M545 Low back pain, unspecified: Secondary | ICD-10-CM

## 2023-01-22 DIAGNOSIS — F419 Anxiety disorder, unspecified: Secondary | ICD-10-CM

## 2023-01-22 DIAGNOSIS — E782 Mixed hyperlipidemia: Secondary | ICD-10-CM

## 2023-01-22 DIAGNOSIS — Z1329 Encounter for screening for other suspected endocrine disorder: Secondary | ICD-10-CM

## 2023-01-22 DIAGNOSIS — Z131 Encounter for screening for diabetes mellitus: Secondary | ICD-10-CM

## 2023-01-22 DIAGNOSIS — Z79899 Other long term (current) drug therapy: Secondary | ICD-10-CM

## 2023-01-22 DIAGNOSIS — K219 Gastro-esophageal reflux disease without esophagitis: Secondary | ICD-10-CM

## 2023-01-22 DIAGNOSIS — Z0001 Encounter for general adult medical examination with abnormal findings: Secondary | ICD-10-CM

## 2023-01-22 DIAGNOSIS — Z136 Encounter for screening for cardiovascular disorders: Secondary | ICD-10-CM

## 2023-01-22 DIAGNOSIS — E559 Vitamin D deficiency, unspecified: Secondary | ICD-10-CM

## 2023-01-22 LAB — CBC WITH DIFFERENTIAL/PLATELET
Absolute Monocytes: 918 cells/uL (ref 200–950)
Basophils Absolute: 30 cells/uL (ref 0–200)
Basophils Relative: 0.4 %
Eosinophils Absolute: 7 cells/uL — ABNORMAL LOW (ref 15–500)
Eosinophils Relative: 0.1 %
HCT: 44.4 % (ref 38.5–50.0)
Hemoglobin: 15.1 g/dL (ref 13.2–17.1)
Lymphs Abs: 1569 cells/uL (ref 850–3900)
MCH: 29.9 pg (ref 27.0–33.0)
MCHC: 34 g/dL (ref 32.0–36.0)
MCV: 87.9 fL (ref 80.0–100.0)
MPV: 10.6 fL (ref 7.5–12.5)
Monocytes Relative: 12.4 %
Neutro Abs: 4877 cells/uL (ref 1500–7800)
Neutrophils Relative %: 65.9 %
Platelets: 222 10*3/uL (ref 140–400)
RBC: 5.05 10*6/uL (ref 4.20–5.80)
RDW: 12.9 % (ref 11.0–15.0)
Total Lymphocyte: 21.2 %
WBC: 7.4 10*3/uL (ref 3.8–10.8)

## 2023-01-22 NOTE — Patient Instructions (Signed)
Tendinitis  Tendinitis is inflammation of a tendon. A tendon is a strong cord of tissue that connects muscle to bone. Tendinitis can affect any tendon, but it most commonly affects the: Shoulder tendon (biceps tendon or rotator cuff). Ankle tendon (Achilles tendon). Elbow tendons. Tendons in the wrist. What are the causes? This condition may be caused by: Overusing a tendon or muscle. This is the most common cause. Age-related wear and tear. Injury. Inflammatory conditions, such as arthritis. Certain medicines. What increases the risk? You are more likely to develop this condition if you do activities that involve the same movements over and over again (repetitive motions). What are the signs or symptoms? Symptoms of this condition may include: Pain. Tenderness. Mild swelling. Decreased range of motion. How is this diagnosed? This condition is diagnosed with a physical exam. You may also have tests, such as: Ultrasound. This uses sound waves to make an image of the inside of your body in the affected area. MRI. This uses magnetic fields and radio waves to make an image of the inside of your body in the affected area. How is this treated? This condition may be treated by resting, icing, applying pressure (compression), and raising (elevating) the affected area above the level of your heart. This is known as RICE therapy. Treatment may also include: Medicines to help reduce inflammation or to help reduce pain. Exercises or physical therapy to improve movement and strength of the tendon. A brace or splint. Injection of corticosteroid medicine. This may be done in some cases. Surgery. This is rarely needed and only used if all other treatment has failed. Follow these instructions at home: If you have a removable splint or brace: Wear the splint or brace as told by your health care provider. Remove it only as told by your health care provider. Check the skin around the splint or brace  every day. Tell your health care provider about any concerns. Loosen the splint or brace if your fingers or toes tingle, become numb, or turn cold and blue. Keep the splint or brace clean. If the splint or brace is not waterproof: Do not let it get wet. Cover it with a watertight covering when you take a bath or shower, or remove it as told by your health care provider. Managing pain, stiffness, and swelling     If directed, put ice on the affected area. To do this: If you have a removable splint or brace, remove it as told by your health care provider. Put ice in a plastic bag. Place a towel between your skin and the bag. Leave the ice on for 20 minutes, 2-3 times a day. Remove the ice if your skin turns bright red. This is very important. If you cannot feel pain, heat, or cold, you have a greater risk of damage to the area. Move the fingers or toes of the affected limb often, if this applies. This can help to reduce stiffness and swelling. If directed, elevate the affected area above the level of your heart while you are sitting or lying down. If directed, apply heat to the affected area before you exercise. Use the heat source that your health care provider recommends, such as a moist heat pack or a heating pad. Place a towel between your skin and the heat source. Leave the heat on for 20-30 minutes. Remove the heat if your skin turns bright red. This is especially important if you are unable to feel pain, heat, or cold. You have a  greater risk of getting burned. Activity Rest the affected area as told by your health care provider. Ask your health care provider when it is safe to drive if you have a splint or brace on any part of your arm or leg. Return to your normal activities as told by your health care provider. Ask your health care provider what activities are safe for you. Avoid using the affected area while you are experiencing symptoms of tendinitis. Do exercises as told by your  health care provider. General instructions Wear an elastic bandage or compression wrap only as told by your health care provider. Take over-the-counter and prescription medicines only as told by your health care provider. Keep all follow-up visits. This is important. Contact a health care provider if: Your symptoms do not improve. You develop new, unexplained problems, such as numbness in your hands or feet. Summary Tendinitis is inflammation of a tendon. You are more likely to develop this condition if you do activities that involve the same movements over and over again. This condition may be treated by resting, icing, applying pressure (compression), and elevating the area above the level of your heart. This is known as RICE therapy. Avoid using the affected area while you are experiencing symptoms of tendinitis. This information is not intended to replace advice given to you by your health care provider. Make sure you discuss any questions you have with your health care provider. Document Revised: 02/15/2021 Document Reviewed: 02/15/2021 Elsevier Patient Education  2024 [employer].

## 2023-01-22 NOTE — Progress Notes (Signed)
Complete Physical  Assessment and Plan:  George Clements was seen today for annual exam.  Diagnoses and all orders for this visit:  Encounter for routine adult health examination without abnormal findings Due annually  Health Maintenance reviewed Healthy lifestyle reviewed and goals set Defers STD Screening  Primary hypertension Controlled without medications. Discussed DASH (Dietary Approaches to Stop Hypertension) DASH diet is lower in sodium than a typical American diet. Cut back on foods that are high in saturated fat, cholesterol, and trans fats. Eat more whole-grain foods, fish, poultry, and nuts Remain active and exercise as tolerated daily.  Monitor BP at home-Call if greater than 130/80.  Check CBC/CMP  Gastroesophageal reflux disease, unspecified whether esophagitis present No suspected reflux complications (Barret/stricture). Lifestyle modification:  wt loss, avoid meals 2-3h before bedtime. Consider eliminating food triggers:  chocolate, caffeine, EtOH, acid/spicy food.   Anxiety Now on day-shift, much improved. Continue lifestyle modifications Stress management techniques discussed, increase water, good sleep hygiene discussed, increase exercise, and increase veggies.   Vitamin D deficiency Continue to recommend supplementation for goal of 60-100 Monitor Vitamin D levels  Mixed hyperlipidemia Slightly elevated Continue ifestyle modifications. Recommended diet heavy in fruits and veggies, omega 3's. Decrease consumption of animal meats, cheeses, and dairy products. Remain active and exercise as tolerated. Continue to monitor.  Chronic midline low back pain without sciatica Improved with regular massage therapy Weight loss encouraged, good posture and body mechanics encouraged Once a month has a massage therapist for deep tissue  Overweight Discussed appropriate BMI Diet modification. Physical activity. Encouraged/praised to build confidence.  Medication  management All medications discussed and reviewed in full. All questions and concerns regarding medications addressed.     Screening for thyroid disorder -     TSH  Screening for hematuria or proteinuria -     Urinalysis, Routine w reflex microscopic  Screening for cardiovascular disease EKG  Screening for hematuria or proteinuria  Check Microalbumin/UA  Screening for DM Monitor A1c/Insulin  Tightness sensation-right hand Brace support IBU for increase in inflammation Continue to monitor  Orders Placed This Encounter  Procedures   CBC with Differential/Platelet   COMPLETE METABOLIC PANEL WITH GFR   Lipid panel   TSH   Hemoglobin A1C w/out eAG   Insulin, random   VITAMIN D 25 Hydroxy (Vit-D Deficiency, Fractures)   Urinalysis, Routine w reflex microscopic   Microalbumin / creatinine urine ratio   EKG 12-Lead    Notify office for further evaluation and treatment, questions or concerns if any reported s/s fail to improve.   The patient was advised to call back or seek an in-person evaluation if any symptoms worsen or if the condition fails to improve as anticipated.   Further disposition pending results of labs. Discussed med's effects and SE's.    I discussed the assessment and treatment plan with the patient. The patient was provided an opportunity to ask questions and all were answered. The patient agreed with the plan and demonstrated an understanding of the instructions.  Discussed med's effects and SE's. Screening labs and tests as requested with regular follow-up as recommended.  I provided 40 minutes of face-to-face time during this encounter including counseling, chart review, and critical decision making was preformed.  Today's Plan of Care is based on a patient-centered health care approach known as shared decision making - the decisions, tests and treatments allow for patient preferences and values to be balanced with clinical evidence.     Future  Appointments  Date Time Provider Department Center  01/22/2024 10:00 AM Ramaya Guile, [name], NP [facility] None    HPI 32 y.o. male patient presents for a complete physical. He has Back pain; Mixed hyperlipidemia; Hypertension; Vitamin D deficiency; Anxiety; Gastroesophageal reflux disease; Sleep disorder, circadian, shift work type; Sleep related choking sensation; Loud snoring; and Overweight (BMI 25.0-29.9) on their problem list.  He is divorced, no kids. Lives with room mate. Works in [employer]. Plays drums in a band. No new sexual partners, declines STD testing.   Overall he reports feeling well today.    He does have increase in right wrist swelling and weakness after playing drums.  Tries to stay well hydrated.  Does not take Tylenol or IBU.    Continuing to work day shift. He reports feeling much better overall with a decrease in GERD symptoms, anxiety and insomnia.   He was reporting non-restorative sleep, loud snoring. Had normal sleep study by Dr. Vickey Huger 11/2020. 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   Has history of back pain- much improved, doing regular deep tissue massage with benefit. Exercise helps.  More active in cooler months.  He also has GERD (controlled with omeprazole).  He is established with [facility], has follow up planned next week. Much improved since starting day shift.  BMI is Body mass index is 29.07 kg/m., he is working on diet and exercise.  Wt Readings from Last 3 Encounters:  01/22/23 211 lb 6.4 oz (95.9 kg)  01/21/22 203 lb (92.1 kg)  01/19/21 199 lb 9.6 oz (90.5 kg)   His blood pressure has been controlled at home, he is off of BP med, today their BP is BP: 122/78 He does workout, walking and weight lifting. He denies chest pain, shortness of breath, dizziness.   He is not on cholesterol  medication, he has been working on diet and exercise.Marland Kitchen No family history of heart disease and smoke. His cholesterol is not at goal. Strong family hx of elevated cholesterol. Has reduced red meat and cut out a lot of daily.  The cholesterol last visit was:   Lab Results  Component Value Date   CHOL 192 01/21/2022   HDL 46 01/21/2022   LDLCALC 120 (H) 01/21/2022   TRIG 149 01/21/2022   CHOLHDL 4.2 01/21/2022   Last GFR: Lab Results  Component Value Date   GFRNONAA 95 01/19/2020    Patient is on Vitamin D supplement, unsure of dose.  Lab Results  Component Value Date   VD25OH 33 01/21/2022      Current Medications:  Current Outpatient Medications on File Prior to Visit  Medication Sig Dispense Refill   ELDERBERRY PO Take by mouth daily. (Patient not taking: Reported on 01/21/2022)     famotidine (PEPCID) 20 MG tablet Take 1 tab 30 min prior to breakfast and bedtime as needed for reflux. (Patient not taking: Reported on 01/21/2022) 60 tablet 2   omeprazole (PRILOSEC) 20 MG capsule Take 1 cap 60 min prior to meal daily for acid reflux. (Patient not taking: Reported on 01/21/2022) 90 capsule 1   traZODone (DESYREL) 150 MG tablet Take 1/2-1 tab 1 hour prior to bedtime as needed for sleep. (Patient not taking: Reported on 01/21/2022) 30 tablet 0   Wheat Dextrin (BENEFIBER PO) Take by mouth daily. (Patient not taking: Reported on 01/21/2022)     No current facility-administered medications on file prior to visit.  Allergies:  No Known Allergies   Health Maintenance:  Immunization History  Administered Date(s) Administered   Tdap 12/30/2016   Tetanus: 2018 Influenza: typically doesn't get  HPV: check with insurance -  COVID discussed  Declines STD screening - defers Sleep study: 11/2020, negative  Eye Exam: Will reach out to employer for coverage with insurance. Currently looking. Dentist: Dr. Kristian Covey, last visit 2024, goes q7m [facility]: [facility]  Dermatology:  referral full body  skin check.    Patient Care Team: Lucky Cowboy, MD as PCP - General (Internal Medicine)  Medical History:  has Back pain; Mixed hyperlipidemia; Hypertension; Vitamin D deficiency; Anxiety; Gastroesophageal reflux disease; Sleep disorder, circadian, shift work type; Sleep related choking sensation; Loud snoring; and Overweight (BMI 25.0-29.9) on their problem list. Surgical History:  He  has a past surgical history that includes No past surgeries. Family History:  His family history includes Asthma in his brother and mother; Bipolar disorder in his sister; Cancer in his paternal grandfather; Drug abuse in his maternal grandfather; Heart murmur in his sister; Hypertension in his sister; Melanoma (age of onset: 45) in his maternal aunt; Pancreatic cancer (age of onset: 67) in his paternal grandmother; Scoliosis in his sister. Social History:   reports that he has never smoked. He has never used smokeless tobacco. He reports current alcohol use. He reports that he does not use drugs.   Review of Systems:  Review of Systems  Constitutional:  Negative for chills, diaphoresis, fever, malaise/fatigue and weight loss.  HENT: Negative.    Eyes: Negative.   Respiratory: Negative.  Negative for shortness of breath.   Cardiovascular: Negative.  Negative for chest pain, palpitations, claudication and leg swelling.  Gastrointestinal:  Negative for abdominal pain, blood in stool, constipation (improved), diarrhea, heartburn, melena, nausea and vomiting.  Genitourinary: Negative.   Musculoskeletal:  Negative for back pain (improved), falls, joint pain, myalgias and neck pain.  Skin: Negative.  Negative for rash.  Neurological:  Negative for dizziness, tingling, tremors, sensory change, speech change, focal weakness, seizures, loss of consciousness, weakness and headaches.  Psychiatric/Behavioral:  Negative for depression, hallucinations, memory loss, substance abuse and suicidal ideas. The patient is  not nervous/anxious and does not have insomnia.     Physical Exam: Estimated body mass index is 29.07 kg/m as calculated from the following:   Height as of this encounter: 5' 11.5" (1.816 m).   Weight as of this encounter: 211 lb 6.4 oz (95.9 kg). BP 122/78   Pulse 76   Temp 97.8 F (36.6 C)   Ht 5' 11.5" (1.816 m)   Wt 211 lb 6.4 oz (95.9 kg)   SpO2 98%   BMI 29.07 kg/m  General Appearance: Well nourished, in no apparent distress.  Eyes: PERRLA, EOMs, conjunctiva no swelling or erythema Sinuses: No Frontal/maxillary tenderness  ENT/Mouth: Ext aud canals clear, normal light reflex with TMs without erythema, bulging. Good dentition. No erythema, swelling, or exudate on post pharynx. Tonsils not swollen or erythematous. Hearing normal.  Neck: Supple, thyroid normal. No bruits  Respiratory: Respiratory effort normal, BS equal bilaterally without rales, rhonchi, wheezing or stridor.  Cardio: RRR without murmurs, rubs or gallops. Brisk peripheral pulses without edema.  Chest: symmetric, with normal excursions and percussion.  Abdomen: Soft, nontender, no guarding, rebound, hernias, masses, or organomegaly.  Lymphatics: Non tender without lymphadenopathy.  Genitourinary: defer, denies concerns, doing regular checks Musculoskeletal: Full ROM all peripheral extremities,5/5 strength, and normal gait.  Skin: Warm, dry without rashes, lesions, ecchymosis. Neuro:  Cranial nerves intact, reflexes equal bilaterally. Normal muscle tone, no cerebellar symptoms. Sensation intact.  Psych: Awake and oriented X 3, normal affect, Insight and Judgment appropriate.   EKG: WNL NSR  Adela Glimpse, NP 10:41 AM [facility]

## 2023-01-26 ENCOUNTER — Encounter: Payer: Self-pay | Admitting: Nurse Practitioner

## 2024-01-22 ENCOUNTER — Encounter: Payer: 59 | Admitting: Nurse Practitioner

## 2024-07-21 ENCOUNTER — Other Ambulatory Visit: Payer: Self-pay | Admitting: Medical Genetics
